# Patient Record
Sex: Female | Born: 1935
Health system: Southern US, Community
[De-identification: ages and names within clinical notes are randomized; demographics above are authoritative.]

## PROBLEM LIST (undated history)

## (undated) DIAGNOSIS — I2510 Atherosclerotic heart disease of native coronary artery without angina pectoris: Secondary | ICD-10-CM

## (undated) DIAGNOSIS — E785 Hyperlipidemia, unspecified: Secondary | ICD-10-CM

## (undated) DIAGNOSIS — F419 Anxiety disorder, unspecified: Secondary | ICD-10-CM

## (undated) DIAGNOSIS — IMO0002 Reserved for concepts with insufficient information to code with codable children: Secondary | ICD-10-CM

## (undated) DIAGNOSIS — I10 Essential (primary) hypertension: Secondary | ICD-10-CM

## (undated) DIAGNOSIS — Z789 Other specified health status: Secondary | ICD-10-CM

## (undated) HISTORY — DX: Atherosclerotic heart disease of native coronary artery without angina pectoris: I25.10

## (undated) HISTORY — DX: Anxiety disorder, unspecified: F41.9

## (undated) HISTORY — DX: Reserved for concepts with insufficient information to code with codable children: IMO0002

## (undated) HISTORY — PX: CORONARY STENT PLACEMENT: SHX1402

## (undated) HISTORY — DX: Hyperlipidemia, unspecified: E78.5

## (undated) HISTORY — DX: Other specified health status: Z78.9

## (undated) HISTORY — PX: CORONARY ANGIOPLASTY: SHX604

## (undated) HISTORY — DX: Essential (primary) hypertension: I10

---

## 1998-01-17 HISTORY — PX: CARDIAC CATHETERIZATION: SHX172

## 1998-10-25 HISTORY — PX: OTHER SURGICAL HISTORY: SHX169

## 1999-02-28 ENCOUNTER — Emergency Department (HOSPITAL_COMMUNITY): Admission: EM | Admit: 1999-02-28 | Discharge: 1999-02-28 | Payer: Self-pay | Admitting: Emergency Medicine

## 1999-02-28 ENCOUNTER — Encounter: Payer: Self-pay | Admitting: Emergency Medicine

## 1999-07-19 ENCOUNTER — Inpatient Hospital Stay (HOSPITAL_COMMUNITY): Admission: EM | Admit: 1999-07-19 | Discharge: 1999-07-22 | Payer: Self-pay | Admitting: Emergency Medicine

## 1999-07-19 ENCOUNTER — Encounter: Payer: Self-pay | Admitting: Emergency Medicine

## 1999-09-23 ENCOUNTER — Encounter: Admission: RE | Admit: 1999-09-23 | Discharge: 1999-12-18 | Payer: Self-pay | Admitting: Neurology

## 2000-07-04 ENCOUNTER — Encounter: Payer: Self-pay | Admitting: *Deleted

## 2000-07-04 ENCOUNTER — Emergency Department (HOSPITAL_COMMUNITY): Admission: EM | Admit: 2000-07-04 | Discharge: 2000-07-04 | Payer: Self-pay | Admitting: Emergency Medicine

## 2000-10-17 ENCOUNTER — Emergency Department (HOSPITAL_COMMUNITY): Admission: EM | Admit: 2000-10-17 | Discharge: 2000-10-17 | Payer: Self-pay | Admitting: Emergency Medicine

## 2000-10-17 ENCOUNTER — Encounter: Payer: Self-pay | Admitting: Emergency Medicine

## 2010-10-25 ENCOUNTER — Emergency Department (HOSPITAL_COMMUNITY)
Admission: EM | Admit: 2010-10-25 | Discharge: 2010-10-25 | Payer: Self-pay | Source: Home / Self Care | Admitting: Emergency Medicine

## 2011-01-04 LAB — URINALYSIS, ROUTINE W REFLEX MICROSCOPIC
Bilirubin Urine: NEGATIVE
Glucose, UA: NEGATIVE mg/dL
Hgb urine dipstick: NEGATIVE
Ketones, ur: NEGATIVE mg/dL
Nitrite: NEGATIVE
Protein, ur: NEGATIVE mg/dL
Specific Gravity, Urine: 1.008 (ref 1.005–1.030)
Urobilinogen, UA: 0.2 mg/dL (ref 0.0–1.0)
pH: 7.5 (ref 5.0–8.0)

## 2011-01-04 LAB — URINE MICROSCOPIC-ADD ON

## 2013-12-17 ENCOUNTER — Encounter: Payer: Self-pay | Admitting: Cardiology

## 2013-12-17 DIAGNOSIS — E785 Hyperlipidemia, unspecified: Secondary | ICD-10-CM

## 2013-12-17 DIAGNOSIS — F411 Generalized anxiety disorder: Secondary | ICD-10-CM | POA: Insufficient documentation

## 2013-12-17 DIAGNOSIS — I10 Essential (primary) hypertension: Secondary | ICD-10-CM | POA: Insufficient documentation

## 2014-03-26 ENCOUNTER — Encounter: Payer: Self-pay | Admitting: Cardiology

## 2014-03-26 ENCOUNTER — Other Ambulatory Visit: Payer: Self-pay | Admitting: *Deleted

## 2014-03-26 ENCOUNTER — Ambulatory Visit (INDEPENDENT_AMBULATORY_CARE_PROVIDER_SITE_OTHER): Payer: Medicare Other | Admitting: Cardiology

## 2014-03-26 VITALS — BP 137/79 | HR 103 | Ht 59.0 in | Wt 162.0 lb

## 2014-03-26 DIAGNOSIS — R011 Cardiac murmur, unspecified: Secondary | ICD-10-CM

## 2014-03-26 DIAGNOSIS — I10 Essential (primary) hypertension: Secondary | ICD-10-CM

## 2014-03-26 DIAGNOSIS — E785 Hyperlipidemia, unspecified: Secondary | ICD-10-CM

## 2014-03-26 DIAGNOSIS — R0602 Shortness of breath: Secondary | ICD-10-CM

## 2014-03-26 DIAGNOSIS — I2510 Atherosclerotic heart disease of native coronary artery without angina pectoris: Secondary | ICD-10-CM

## 2014-03-26 MED ORDER — AMLODIPINE BESYLATE 10 MG PO TABS: 10.0000 mg | ORAL_TABLET | Freq: Every day | ORAL | Status: DC

## 2014-03-26 MED ORDER — PRAVASTATIN SODIUM 40 MG PO TABS: 40.0000 mg | ORAL_TABLET | Freq: Every day | ORAL | Status: DC

## 2014-03-26 MED ORDER — METOPROLOL TARTRATE 50 MG PO TABS: 50.0000 mg | ORAL_TABLET | Freq: Two times a day (BID) | ORAL | Status: DC

## 2014-03-26 MED ORDER — TRIAMTERENE-HCTZ 37.5-25 MG PO CAPS
1.0000 | ORAL_CAPSULE | Freq: Every day | ORAL | Status: DC
Start: 2014-03-26 — End: 2014-12-09

## 2014-03-26 NOTE — Progress Notes (Signed)
[facility] [place], [place], [place]  [address] Phone: [phone] Fax:  [phone]  Date:  03/26/2014   ID:  Suzanne Miller, DOB Jan 23, 1930, MRN [id number]  PCP:  No primary provider on file.  Cardiologist:  Fransico Him, MD     History of Present Illness: Suzanne Miller is a 80 y.o. female with a history of single vessel ASCAD s/p PCI of mid RCA in 1999, HTN and dyslipidemia who presents today for followup.  She is doing well.  She denies any chest pain, LE edema, dizziness, palpitations or syncope.  She has chronic DOE which she thinks has gotten worse recently.  She has difficulty ambulating any more due to DOE.  She also has back pain and has to walk with a cane.  Her heart races if she gets upset but it resolves when she takes a Lorazepam.   Wt Readings from Last 3 Encounters:  03/26/14 162 lb (73.483 kg)     Past Medical History  Diagnosis Date  . ASCVD (arteriosclerotic cardiovascular disease)     singel vessel s/p PCI of RCA  . Hypertension   . Anxiety     intermittent  . Statin intolerance     most statins and zetia  . Positional vertigo     chronic  . Hyperlipidemia   . CAD (coronary artery disease), native coronary artery     s/p PCI of RCA    Current Outpatient Prescriptions  Medication Sig Dispense Refill  . amLODipine (NORVASC) 10 MG tablet Take 10 mg by mouth daily.      Marland Kitchen aspirin 81 MG tablet Take 162 mg by mouth daily.      . diazepam (VALIUM) 5 MG tablet Take 0.25 mg by mouth every 6 (six) hours as needed for anxiety.      . metoprolol (LOPRESSOR) 50 MG tablet Take 50 mg by mouth 2 (two) times daily.      . Multiple Vitamins-Minerals (MULTIVITAMIN PO) Take by mouth.      . nitroGLYCERIN (NITROSTAT) 0.4 MG SL tablet Place 0.4 mg under the tongue every 5 (five) minutes as needed for chest pain.      . pravastatin (PRAVACHOL) 40 MG tablet Take 40 mg by mouth daily.      Marland Kitchen triamterene-hydrochlorothiazide (DYAZIDE) 37.5-25 MG per capsule Take 1 capsule  by mouth daily.      . vitamin B-12 (CYANOCOBALAMIN) 500 MCG tablet Take 500 mcg by mouth daily.      . Vitamin D, Cholecalciferol, 1000 UNITS TABS Take by mouth.       No current facility-administered medications for this visit.    Allergies:    Allergies  Allergen Reactions  . Penicillins   . Sulfa Antibiotics   . Ticlid [Ticlopidine]     Social History:  The patient  reports that she has quit smoking. She does not have any smokeless tobacco history on file. She reports that she does not drink alcohol or use illicit drugs.   Family History:  The patient's family history includes Arrhythmia in her sister; Diabetes in her brother, brother, mother, and sister; Heart disease in her brother, brother, and sister; Pneumonia in her father.   ROS:  Please see the history of present illness.      All other systems reviewed and negative.   PHYSICAL EXAM: VS:  BP 137/79  Pulse 103  Ht 4\' 11"  (1.499 m)  Wt 162 lb (73.483 kg)  BMI 32.70 kg/m2 Well nourished, well  developed, in no acute distress HEENT: normal Neck: no JVD Cardiac:  normal S1, S2; RRR; 1/6 SM at LUSB to LLSB Lungs:  clear to auscultation bilaterally, no wheezing, rhonchi or rales Abd: soft, nontender, no hepatomegaly Ext: no edema Skin: warm and dry Neuro:  CNs 2-12 intact, no focal abnormalities noted  EKG:  Sinus tachycardia at 103bpm with nonspecific T wave abnormality     ASSESSMENT AND PLAN:  1. Single vessel ASCAD s/p PCI of RCA remotely with no angina - continue ASA - Lexiscan myoview to rule out ischemia given her worsening DOE 2. HTN well controlled - continue metoprolol/amlodipine/Dyazide - check BMET 3. Dyslipidemia - check fasting lipid and ALT - continue pravastatin 4.  Heart murmur - check 2D echo  Followup with me in 1 year  Signed, Fransico Him, MD 03/26/2014 2:02 PM

## 2014-03-26 NOTE — Patient Instructions (Signed)
Your physician recommends that you continue on your current medications as directed. Please refer to the Current Medication list given to you today. Your physician has requested that you have a lexiscan myoview. For further information please visit [email or web address]. Please follow instruction sheet, as given.  Your physician has requested that you have an echocardiogram. Echocardiography is a painless test that uses sound waves to create images of your heart. It provides your doctor with information about the size and shape of your heart and how well your heart's chambers and valves are working. This procedure takes approximately one hour. There are no restrictions for this procedure.  Your physician recommends that you return for lab work in: [place].  (LIPIDS, ALT, BMET)

## 2014-03-27 ENCOUNTER — Other Ambulatory Visit: Payer: Self-pay | Admitting: *Deleted

## 2014-03-27 MED ORDER — NITROGLYCERIN 0.4 MG SL SUBL: 0.4000 mg | SUBLINGUAL_TABLET | SUBLINGUAL | Status: DC | PRN

## 2014-04-04 ENCOUNTER — Other Ambulatory Visit (INDEPENDENT_AMBULATORY_CARE_PROVIDER_SITE_OTHER): Payer: Medicare Other

## 2014-04-04 DIAGNOSIS — I2510 Atherosclerotic heart disease of native coronary artery without angina pectoris: Secondary | ICD-10-CM

## 2014-04-04 DIAGNOSIS — E785 Hyperlipidemia, unspecified: Secondary | ICD-10-CM

## 2014-04-04 DIAGNOSIS — R011 Cardiac murmur, unspecified: Secondary | ICD-10-CM

## 2014-04-04 DIAGNOSIS — R0602 Shortness of breath: Secondary | ICD-10-CM

## 2014-04-04 DIAGNOSIS — I10 Essential (primary) hypertension: Secondary | ICD-10-CM

## 2014-04-04 LAB — LIPID PANEL
Cholesterol: 145 mg/dL (ref 0–200)
HDL: 38.1 mg/dL — AB (ref >39.00)
LDL Cholesterol: 79 mg/dL (ref 0–99)
NonHDL: 106.9
TRIGLYCERIDES: 141 mg/dL (ref 0.0–149.0)
Total CHOL/HDL Ratio: 4
VLDL: 28.2 mg/dL (ref 0.0–40.0)

## 2014-04-04 LAB — BASIC METABOLIC PANEL
BUN: 20 mg/dL (ref 6–23)
CALCIUM: 9.2 mg/dL (ref 8.4–10.5)
CO2: 28 meq/L (ref 19–32)
Chloride: 106 mEq/L (ref 96–112)
Creatinine, Ser: 1.5 mg/dL — ABNORMAL HIGH (ref 0.4–1.2)
GFR: 35.93 mL/min — AB (ref >60.00)
Glucose, Bld: 100 mg/dL — ABNORMAL HIGH (ref 70–99)
Potassium: 3.9 mEq/L (ref 3.5–5.1)
SODIUM: 141 meq/L (ref 135–145)

## 2014-04-04 LAB — ALT: ALT: 33 U/L (ref 0–35)

## 2014-04-08 ENCOUNTER — Telehealth: Payer: Self-pay | Admitting: Cardiology

## 2014-04-08 NOTE — Telephone Encounter (Signed)
Follow Up    Pt calling following up on lab results. Please call.

## 2014-04-08 NOTE — Telephone Encounter (Signed)
PT is aware of results.  

## 2014-04-09 ENCOUNTER — Other Ambulatory Visit: Payer: Self-pay | Admitting: General Surgery

## 2014-04-09 ENCOUNTER — Encounter: Payer: Self-pay | Admitting: General Surgery

## 2014-04-09 DIAGNOSIS — E785 Hyperlipidemia, unspecified: Secondary | ICD-10-CM

## 2014-04-16 ENCOUNTER — Other Ambulatory Visit (HOSPITAL_COMMUNITY): Payer: Medicare Other

## 2014-05-07 ENCOUNTER — Other Ambulatory Visit (HOSPITAL_COMMUNITY): Payer: Medicare Other

## 2014-06-24 ENCOUNTER — Other Ambulatory Visit: Payer: Self-pay

## 2014-10-09 ENCOUNTER — Other Ambulatory Visit (INDEPENDENT_AMBULATORY_CARE_PROVIDER_SITE_OTHER): Payer: Medicare Other | Admitting: *Deleted

## 2014-10-09 DIAGNOSIS — E785 Hyperlipidemia, unspecified: Secondary | ICD-10-CM

## 2014-10-09 LAB — LIPID PANEL
CHOLESTEROL: 140 mg/dL (ref 0–200)
HDL: 38.1 mg/dL — AB (ref >39.00)
LDL Cholesterol: 81 mg/dL (ref 0–99)
NonHDL: 101.9
Total CHOL/HDL Ratio: 4
Triglycerides: 103 mg/dL (ref 0.0–149.0)
VLDL: 20.6 mg/dL (ref 0.0–40.0)

## 2014-10-09 LAB — HEPATIC FUNCTION PANEL
ALBUMIN: 4.1 g/dL (ref 3.5–5.2)
ALK PHOS: 76 U/L (ref 39–117)
ALT: 28 U/L (ref 0–35)
AST: 28 U/L (ref 0–37)
Bilirubin, Direct: 0 mg/dL (ref 0.0–0.3)
TOTAL PROTEIN: 7 g/dL (ref 6.0–8.3)
Total Bilirubin: 0.5 mg/dL (ref 0.2–1.2)

## 2014-10-10 ENCOUNTER — Telehealth: Payer: Self-pay | Admitting: Cardiology

## 2014-10-10 DIAGNOSIS — E785 Hyperlipidemia, unspecified: Secondary | ICD-10-CM

## 2014-10-10 NOTE — Telephone Encounter (Signed)
Pt is aware of lab results and MD's recommendations. Pt is aware to take 80 mg of Pravastatin. Pt States that she does not need for [place] to send a new prescription now. Pt has an appointment for labs FLP and ALT on Monday  March 1 st. Pt is aware.

## 2014-10-10 NOTE — Telephone Encounter (Signed)
New Msg     Pt returning call about blood work. Please contact pt at [phone].

## 2014-10-11 ENCOUNTER — Other Ambulatory Visit: Payer: Self-pay

## 2014-10-11 MED ORDER — PRAVASTATIN SODIUM 40 MG PO TABS: 80.0000 mg | ORAL_TABLET | Freq: Every day | ORAL | Status: DC

## 2014-12-09 ENCOUNTER — Other Ambulatory Visit: Payer: Self-pay | Admitting: Cardiology

## 2014-12-24 ENCOUNTER — Other Ambulatory Visit: Payer: Medicare Other

## 2015-01-07 ENCOUNTER — Other Ambulatory Visit (INDEPENDENT_AMBULATORY_CARE_PROVIDER_SITE_OTHER): Payer: Medicare Other | Admitting: *Deleted

## 2015-01-07 DIAGNOSIS — E785 Hyperlipidemia, unspecified: Secondary | ICD-10-CM

## 2015-01-07 LAB — LIPID PANEL
CHOL/HDL RATIO: 4
Cholesterol: 141 mg/dL (ref 0–200)
HDL: 40 mg/dL (ref >39.00)
LDL CALC: 79 mg/dL (ref 0–99)
NonHDL: 101
TRIGLYCERIDES: 112 mg/dL (ref 0.0–149.0)
VLDL: 22.4 mg/dL (ref 0.0–40.0)

## 2015-01-07 LAB — ALT: ALT: 29 U/L (ref 0–35)

## 2015-04-10 ENCOUNTER — Ambulatory Visit (INDEPENDENT_AMBULATORY_CARE_PROVIDER_SITE_OTHER): Payer: Medicare Other | Admitting: Cardiology

## 2015-04-10 ENCOUNTER — Encounter: Payer: Self-pay | Admitting: Cardiology

## 2015-04-10 VITALS — BP 142/70 | HR 74 | Ht 59.0 in | Wt 162.8 lb

## 2015-04-10 DIAGNOSIS — I10 Essential (primary) hypertension: Secondary | ICD-10-CM

## 2015-04-10 DIAGNOSIS — I2510 Atherosclerotic heart disease of native coronary artery without angina pectoris: Secondary | ICD-10-CM | POA: Diagnosis not present

## 2015-04-10 DIAGNOSIS — E785 Hyperlipidemia, unspecified: Secondary | ICD-10-CM

## 2015-04-10 DIAGNOSIS — R011 Cardiac murmur, unspecified: Secondary | ICD-10-CM

## 2015-04-10 MED ORDER — PRAVASTATIN SODIUM 40 MG PO TABS: 40.0000 mg | ORAL_TABLET | Freq: Every day | ORAL | Status: DC

## 2015-04-10 MED ORDER — TRIAMTERENE-HCTZ 37.5-25 MG PO CAPS
1.0000 | ORAL_CAPSULE | Freq: Every day | ORAL | Status: DC
Start: 2015-04-10 — End: 2015-04-24

## 2015-04-10 MED ORDER — AMLODIPINE BESYLATE 10 MG PO TABS: 10.0000 mg | ORAL_TABLET | Freq: Every day | ORAL | Status: DC

## 2015-04-10 MED ORDER — METOPROLOL TARTRATE 50 MG PO TABS: 50.0000 mg | ORAL_TABLET | Freq: Two times a day (BID) | ORAL | Status: DC

## 2015-04-10 NOTE — Patient Instructions (Addendum)
Medication Instructions:  Your physician recommends that you continue on your current medications as directed. Please refer to the Current Medication list given to you today.   Labwork: Your physician recommends that you return for FASTING lab work in: [place] (BMET, LFTs, Lipids).  Testing/Procedures: Your physician has requested that you have an echocardiogram. Echocardiography is a painless test that uses sound waves to create images of your heart. It provides your doctor with information about the size and shape of your heart and how well your heart's chambers and valves are working. This procedure takes approximately one hour. There are no restrictions for this procedure.  Follow-Up: Your physician wants you to follow-up in: 1 year with Dr. Radford Pax. You will receive a reminder letter in the mail two months in advance. If you don't receive a letter, please call our office to schedule the follow-up appointment.   Any Other Special Instructions Will Be Listed Below (If Applicable).

## 2015-04-10 NOTE — Addendum Note (Signed)
Addended by: Harland German A on: 04/10/2015 03:03 PM   Modules accepted: Orders

## 2015-04-10 NOTE — Progress Notes (Signed)
Cardiology Office Note   Date:  04/10/2015   ID:  Suzanne Miller, DOB 26-Feb-1930, MRN [id number]  PCP:  Lottie Dawson, MD    Chief Complaint  Patient presents with  . Follow-up    SOB      History of Present Illness: Suzanne Miller is a 81 y.o. female with a history of single vessel ASCAD s/p PCI of mid RCA in 1999, HTN and dyslipidemia who presents today for followup. She is doing well. She denies any chest pain, SOB, DOE, LE edema, dizziness, palpitations or syncope. She has chronic DOE. She also has back pain and has to walk with a cane. She was supposed to have a stress test for DOE and echo for heart murmur a year ago but she never got these done.  She says that she does not want a nuclear stress test.  She now tells me that she never had CP or SOB with walking and that it was low back.      Past Medical History  Diagnosis Date  . ASCVD (arteriosclerotic cardiovascular disease)     singel vessel s/p PCI of RCA  . Hypertension   . Anxiety     intermittent  . Statin intolerance     most statins and zetia  . Positional vertigo     chronic  . Hyperlipidemia   . CAD (coronary artery disease), native coronary artery     s/p PCI of RCA    Past Surgical History  Procedure Laterality Date  . Broken ankle  2000  . Cardiac catheterization  01/17/1998    single vessel, s/p PTCA stent mid RCA   . Coronary angioplasty       Current Outpatient Prescriptions  Medication Sig Dispense Refill  . amLODipine (NORVASC) 10 MG tablet Take 1 tablet by mouth  daily 90 tablet 1  . aspirin 81 MG tablet Take 162 mg by mouth daily.    . diazepam (VALIUM) 5 MG tablet Take 0.25 mg by mouth every 6 (six) hours as needed for anxiety.    . metoprolol (LOPRESSOR) 50 MG tablet Take 1 tablet by mouth two  times daily 180 tablet 1  . Multiple Vitamins-Minerals (MULTIVITAMIN PO) Take by mouth.    . nitroGLYCERIN (NITROSTAT) 0.4 MG SL tablet Place 1 tablet (0.4  mg total) under the tongue every 5 (five) minutes as needed for chest pain. 25 tablet 5  . pravastatin (PRAVACHOL) 40 MG tablet Take 1 tablet by mouth  daily 90 tablet 2  . triamterene-hydrochlorothiazide (DYAZIDE) 37.5-25 MG per capsule Take 1 capsule by mouth  daily 90 capsule 1  . vitamin B-12 (CYANOCOBALAMIN) 500 MCG tablet Take 500 mcg by mouth daily.    . Vitamin D, Cholecalciferol, 1000 UNITS TABS Take by mouth.     No current facility-administered medications for this visit.    Allergies:   Penicillins; Sulfa antibiotics; and Ticlid    Social History:  The patient  reports that she has quit smoking. She does not have any smokeless tobacco history on file. She reports that she does not drink alcohol or use illicit drugs.   Family History:  The patient's family history includes Arrhythmia in her sister; Diabetes in her brother, brother, mother, and sister; Heart disease in her brother, brother, and sister; Pneumonia in her father.    ROS:  Please see the history of present illness.  Otherwise, review of systems are positive for constipation, back and muscle pain.   All other systems are reviewed and negative.    PHYSICAL EXAM: VS:  BP 142/70 mmHg  Pulse 74  Ht 4\' 11"  (1.499 m)  Wt 162 lb 12.8 oz (73.846 kg)  BMI 32.86 kg/m2 , BMI Body mass index is 32.86 kg/(m^2). GEN: Well nourished, well developed, in no acute distress HEENT: normal Neck: no JVD, carotid bruits, or masses Cardiac: RRR; no murmurs, rubs, or gallops,no edema  Respiratory:  clear to auscultation bilaterally, normal work of breathing GI: soft, nontender, nondistended, + BS MS: no deformity or atrophy Skin: warm and dry, no rash Neuro:  Strength and sensation are intact Psych: euthymic mood, full affect   EKG:  EKG was ordered today and showed NSR with no ST changes    Recent Labs: 01/07/2015: ALT 29    Lipid Panel    Component Value Date/Time   CHOL 141 01/07/2015 0909   TRIG 112.0 01/07/2015  0909   HDL 40.00 01/07/2015 0909   CHOLHDL 4 01/07/2015 0909   VLDL 22.4 01/07/2015 0909   LDLCALC 79 01/07/2015 0909      Wt Readings from Last 3 Encounters:  04/10/15 162 lb 12.8 oz (73.846 kg)  03/26/14 162 lb (73.483 kg)    ASSESSMENT AND PLAN:  1. Single vessel ASCAD s/p PCI of RCA remotely - she denies any chest pain or DOE - continue ASA 2. HTN well controlled - continue metoprolol/amlodipine/Dyazide - check BMET 3. Dyslipidemia - last LDL 79 (goal <70) - check fasting lipid and ALT - continue pravastatin 4. Heart murmur- I will get a 2D echo      Current medicines are reviewed at length with the patient today.  The patient does not have concerns regarding medicines.  The following changes have been made:  no change  Labs/ tests ordered today: See above Assessment and Plan No orders of the defined types were placed in this encounter.     Disposition:   FU with me in 1 year  Signed, Sueanne Margarita, MD  04/10/2015 2:27 PM    [facility] [place], [place], [place]  [address] Phone: [phone]; Fax: [phone]

## 2015-04-22 ENCOUNTER — Other Ambulatory Visit (INDEPENDENT_AMBULATORY_CARE_PROVIDER_SITE_OTHER): Payer: Medicare Other

## 2015-04-22 DIAGNOSIS — I10 Essential (primary) hypertension: Secondary | ICD-10-CM

## 2015-04-22 DIAGNOSIS — E785 Hyperlipidemia, unspecified: Secondary | ICD-10-CM

## 2015-04-22 LAB — LIPID PANEL
CHOL/HDL RATIO: 4
Cholesterol: 141 mg/dL (ref 0–200)
HDL: 37.8 mg/dL — AB (ref >39.00)
LDL CALC: 79 mg/dL (ref 0–99)
NONHDL: 103.2
Triglycerides: 123 mg/dL (ref 0.0–149.0)
VLDL: 24.6 mg/dL (ref 0.0–40.0)

## 2015-04-22 LAB — HEPATIC FUNCTION PANEL
ALBUMIN: 4.2 g/dL (ref 3.5–5.2)
ALT: 24 U/L (ref 0–35)
AST: 25 U/L (ref 0–37)
Alkaline Phosphatase: 75 U/L (ref 39–117)
BILIRUBIN DIRECT: 0 mg/dL (ref 0.0–0.3)
BILIRUBIN TOTAL: 0.5 mg/dL (ref 0.2–1.2)
Total Protein: 6.9 g/dL (ref 6.0–8.3)

## 2015-04-22 LAB — BASIC METABOLIC PANEL
BUN: 23 mg/dL (ref 6–23)
CALCIUM: 9.4 mg/dL (ref 8.4–10.5)
CO2: 27 mEq/L (ref 19–32)
Chloride: 105 mEq/L (ref 96–112)
Creatinine, Ser: 1.57 mg/dL — ABNORMAL HIGH (ref 0.40–1.20)
GFR: 33.48 mL/min — AB (ref >60.00)
Glucose, Bld: 114 mg/dL — ABNORMAL HIGH (ref 70–99)
POTASSIUM: 4.1 meq/L (ref 3.5–5.1)
Sodium: 142 mEq/L (ref 135–145)

## 2015-04-24 ENCOUNTER — Telehealth: Payer: Self-pay

## 2015-04-24 DIAGNOSIS — I10 Essential (primary) hypertension: Secondary | ICD-10-CM

## 2015-04-24 NOTE — Telephone Encounter (Signed)
-----   Message from Sueanne Margarita, MD sent at 04/22/2015 10:38 PM EDT ----- LDL not at goal - increase pravachol to 80mg  daily and recheck FLP and ALT in 6 weeks.  STop dyazide due to renal insuff and recheck BMET in 1 week

## 2015-04-24 NOTE — Telephone Encounter (Signed)
Informed patient of results and verbal understanding expressed.  Instructed patient to INCREASE PRAVACHOL to 80 mg daily. Patient refuses, stating she has tried the higher dose and it made her "too achy."  She st she will improve her diet and exercise to get her numbers down instead. Instructed patient to [place]. BMET scheduled for Monday July 11. Patient agrees with treatment plan.

## 2015-05-05 ENCOUNTER — Other Ambulatory Visit (INDEPENDENT_AMBULATORY_CARE_PROVIDER_SITE_OTHER): Payer: Medicare Other | Admitting: *Deleted

## 2015-05-05 DIAGNOSIS — I10 Essential (primary) hypertension: Secondary | ICD-10-CM | POA: Diagnosis not present

## 2015-05-05 LAB — BASIC METABOLIC PANEL
BUN: 20 mg/dL (ref 6–23)
CALCIUM: 9.2 mg/dL (ref 8.4–10.5)
CO2: 28 meq/L (ref 19–32)
Chloride: 108 mEq/L (ref 96–112)
Creatinine, Ser: 1.46 mg/dL — ABNORMAL HIGH (ref 0.40–1.20)
GFR: 36.4 mL/min — ABNORMAL LOW (ref >60.00)
Glucose, Bld: 98 mg/dL (ref 70–99)
POTASSIUM: 3.9 meq/L (ref 3.5–5.1)
Sodium: 143 mEq/L (ref 135–145)

## 2015-05-05 NOTE — Addendum Note (Signed)
Addended by: Eulis Foster on: 05/05/2015 08:35 AM   Modules accepted: Orders

## 2015-05-13 ENCOUNTER — Other Ambulatory Visit (HOSPITAL_COMMUNITY): Payer: Medicare Other

## 2016-07-06 ENCOUNTER — Ambulatory Visit (INDEPENDENT_AMBULATORY_CARE_PROVIDER_SITE_OTHER): Payer: Medicare Other | Admitting: Podiatry

## 2016-07-06 ENCOUNTER — Encounter: Payer: Self-pay | Admitting: Podiatry

## 2016-07-06 DIAGNOSIS — L600 Ingrowing nail: Secondary | ICD-10-CM

## 2016-07-06 DIAGNOSIS — L603 Nail dystrophy: Secondary | ICD-10-CM | POA: Diagnosis not present

## 2016-07-06 NOTE — Progress Notes (Signed)
   Subjective:    Patient ID: Suzanne Miller, female    DOB: 24-Apr-1930, 82 y.o.   MRN: [id number]  HPI: She presents today with a chief complaint of pain to the right hallux nail plate. She states that she bumped the nail a few weeks ago and it seemed to have loosened. She states that I am unable to cut this nail because it is so thick and discolored. He states that she keeps it covered with a Band-Aid so that it won't hurt.    Review of Systems  Musculoskeletal: Positive for arthralgias and gait problem.  All other systems reviewed and are negative.      Objective:   Physical Exam:Vital signs are stable she is alert and oriented 3. Pulses are strongly palpable. Neurologic sensorium is intact. Deep tendon reflexes are intact bilateral muscle strength is normal symmetrical bilateral. Orthopedic evaluation demonstrates all joints distal to the ankle for range of motion without crepitation. Cutaneous evaluation demonstrates supple hydrated cutis no erythema edema cellulitis drainage or odor. Toenail is thick and dystrophic. There is mild erythema proximally painful on palpation as well as evaluation.          Assessment & Plan:  Nail dystrophy hallux right. With some paronychia.  Plan: Total nail avulsion was performed today with resection of all necrotic tissue. This was performed after local anesthesia was administered. She tolerated this procedure well. She will start soaking in Betadine warm water and was provided with oral N home-going instructions for the care and soaking of her toe. I will follow-up with her in a couple of weeks.

## 2016-07-06 NOTE — Patient Instructions (Signed)

## 2016-07-07 ENCOUNTER — Emergency Department (HOSPITAL_COMMUNITY)
Admission: EM | Admit: 2016-07-07 | Discharge: 2016-07-07 | Disposition: A | Discharge status: Home / Self Care | Payer: Medicare Other | Attending: Emergency Medicine | Admitting: Emergency Medicine

## 2016-07-07 ENCOUNTER — Encounter (HOSPITAL_COMMUNITY): Payer: Self-pay | Admitting: Emergency Medicine

## 2016-07-07 ENCOUNTER — Emergency Department (HOSPITAL_COMMUNITY): Payer: Medicare Other

## 2016-07-07 DIAGNOSIS — R0789 Other chest pain: Secondary | ICD-10-CM | POA: Diagnosis not present

## 2016-07-07 DIAGNOSIS — I10 Essential (primary) hypertension: Secondary | ICD-10-CM | POA: Insufficient documentation

## 2016-07-07 DIAGNOSIS — Z87891 Personal history of nicotine dependence: Secondary | ICD-10-CM | POA: Diagnosis not present

## 2016-07-07 DIAGNOSIS — Z7982 Long term (current) use of aspirin: Secondary | ICD-10-CM | POA: Insufficient documentation

## 2016-07-07 DIAGNOSIS — I2510 Atherosclerotic heart disease of native coronary artery without angina pectoris: Secondary | ICD-10-CM | POA: Insufficient documentation

## 2016-07-07 DIAGNOSIS — Z955 Presence of coronary angioplasty implant and graft: Secondary | ICD-10-CM | POA: Insufficient documentation

## 2016-07-07 DIAGNOSIS — R079 Chest pain, unspecified: Secondary | ICD-10-CM

## 2016-07-07 DIAGNOSIS — Z79899 Other long term (current) drug therapy: Secondary | ICD-10-CM | POA: Diagnosis not present

## 2016-07-07 LAB — CBC
HCT: 39.1 % (ref 36.0–46.0)
HEMOGLOBIN: 12.6 g/dL (ref 12.0–15.0)
MCH: 29.5 pg (ref 26.0–34.0)
MCHC: 32.2 g/dL (ref 30.0–36.0)
MCV: 91.6 fL (ref 78.0–100.0)
Platelets: 195 10*3/uL (ref 150–400)
RBC: 4.27 MIL/uL (ref 3.87–5.11)
RDW: 12.4 % (ref 11.5–15.5)
WBC: 3.9 10*3/uL — ABNORMAL LOW (ref 4.0–10.5)

## 2016-07-07 LAB — BASIC METABOLIC PANEL
ANION GAP: 9 (ref 5–15)
BUN: 18 mg/dL (ref 6–20)
CALCIUM: 9.3 mg/dL (ref 8.9–10.3)
CO2: 23 mmol/L (ref 22–32)
Chloride: 108 mmol/L (ref 101–111)
Creatinine, Ser: 1.29 mg/dL — ABNORMAL HIGH (ref 0.44–1.00)
GFR calc Af Amer: 43 mL/min — ABNORMAL LOW (ref >60)
GFR, EST NON AFRICAN AMERICAN: 37 mL/min — AB (ref >60)
GLUCOSE: 126 mg/dL — AB (ref 65–99)
Potassium: 4 mmol/L (ref 3.5–5.1)
Sodium: 140 mmol/L (ref 135–145)

## 2016-07-07 LAB — I-STAT TROPONIN, ED
TROPONIN I, POC: 0 ng/mL (ref 0.00–0.08)
Troponin i, poc: 0 ng/mL (ref 0.00–0.08)

## 2016-07-07 MED ORDER — ASPIRIN 81 MG PO CHEW
324.0000 mg | CHEWABLE_TABLET | Freq: Once | ORAL | Status: AC
Start: 2016-07-07 — End: 2016-07-07
  Administered 2016-07-07: 324 mg via ORAL
  Filled 2016-07-07: qty 4

## 2016-07-07 NOTE — ED Notes (Signed)
Patient left at this time with all belongings. 

## 2016-07-07 NOTE — ED Notes (Signed)
Pt out in hall waiting for blood draw.

## 2016-07-07 NOTE — ED Triage Notes (Signed)
Arrives with chest pain/pressure that woke her from sleep, states resolved with half a diazepam. Pt states she thinks she's anxious. No pain at this time. Hx stent placement

## 2016-07-07 NOTE — ED Provider Notes (Signed)
[facility] DEPT Provider Note   CSN: [id number] Arrival date & time: 07/07/16  [id number]  By signing my name below, I, Suzanne Miller, attest that this documentation has been prepared under the direction and in the presence of Suzanne Biles, MD . Electronically Signed: Higinio Miller, Scribe. 07/07/2016. 4:07 AM.  History   Chief Complaint Chief Complaint  Patient presents with  . Chest Pain   The history is provided by the patient. No language interpreter was used.   HPI Comments: Suzanne Miller is a 82 y.o. female with PMHx of CAD, HLD, HTN and ASCVD, who presents to the Emergency Department complaining of gradually resolved, non-radiating, left sided chest pain described as chest tightness that began in the middles of the night and worsened PTA. Pt reports she awoke suddenly from sleep and had difficulty falling back asleep due to tightness in her chest. She notes she took half of a diazepam with moderate relief. Pt states her pain lasted for ~30 minutes; she states she is no longer experiencing pain in the ED. She denies nausea, dizziness, diaphoresis, shortness of breath. She notes her pain could be due to anxiety as she gets scared easily. Pt reports PSHx of cardiac catheterization in on 01/17/98; she states it has been "a while" since her last stress test.   Past Medical History:  Diagnosis Date  . Anxiety    intermittent  . ASCVD (arteriosclerotic cardiovascular disease)    singel vessel s/p PCI of RCA  . CAD (coronary artery disease), native coronary artery    s/p PCI of RCA  . Hyperlipidemia   . Hypertension   . Positional vertigo    chronic  . Statin intolerance    most statins and zetia    Patient Active Problem List   Diagnosis Date Noted  . SOB (shortness of breath) 03/26/2014  . Hyperlipidemia   . CAD (coronary artery disease), native coronary artery   . Essential hypertension, benign 12/17/2013  . Anxiety state, unspecified 12/17/2013    Past Surgical History:    Procedure Laterality Date  . broken ankle  2000  . CARDIAC CATHETERIZATION  01/17/1998   single vessel, s/p PTCA stent mid RCA   . CORONARY ANGIOPLASTY    . CORONARY STENT PLACEMENT      OB History    No data available     Home Medications    Prior to Admission medications   Medication Sig Start Date End Date Taking? Authorizing Provider  amLODipine (NORVASC) 10 MG tablet Take 1 tablet (10 mg total) by mouth daily. 04/10/15  Yes Sueanne Margarita, MD  aspirin 81 MG tablet Take 162 mg by mouth daily.   Yes Historical Provider, MD  diazepam (VALIUM) 5 MG tablet Take 0.25 mg by mouth every 6 (six) hours as needed for anxiety.   Yes Historical Provider, MD  metoprolol (LOPRESSOR) 50 MG tablet Take 1 tablet (50 mg total) by mouth 2 (two) times daily. 04/10/15  Yes Sueanne Margarita, MD  Multiple Vitamins-Minerals (MULTIVITAMIN PO) Take by mouth.   Yes Historical Provider, MD  nitroGLYCERIN (NITROSTAT) 0.4 MG SL tablet Place 1 tablet (0.4 mg total) under the tongue every 5 (five) minutes as needed for chest pain. 03/27/14  Yes Sueanne Margarita, MD  pravastatin (PRAVACHOL) 20 MG tablet Take 20 mg by mouth daily.   Yes Historical Provider, MD  vitamin B-12 (CYANOCOBALAMIN) 500 MCG tablet Take 500 mcg by mouth daily.   Yes Historical Provider, MD  Vitamin D, Cholecalciferol, 1000  UNITS TABS Take by mouth.   Yes Historical Provider, MD  pravastatin (PRAVACHOL) 40 MG tablet Take 1 tablet (40 mg total) by mouth daily. Patient not taking: Reported on 07/07/2016 04/10/15   Sueanne Margarita, MD    Family History Family History  Problem Relation Age of Onset  . Diabetes Mother   . Pneumonia Father   . Heart disease Sister   . Diabetes Sister   . Heart disease Brother   . Diabetes Brother   . Arrhythmia Sister   . Heart disease Brother   . Diabetes Brother     Social History Social History  Substance Use Topics  . Smoking status: Former Research scientist (life sciences)  . Smokeless tobacco: Never Used  . Alcohol use No      Allergies   Penicillins; Sulfa antibiotics; and Ticlid [ticlopidine]   Review of Systems Review of Systems  Constitutional: Negative for diaphoresis and fever.  Respiratory: Negative for shortness of breath.   Cardiovascular: Positive for chest pain.  Gastrointestinal: Negative for nausea.  Neurological: Negative for dizziness.  All other systems reviewed and are negative.  Physical Exam Updated Vital Signs BP 168/78 (BP Location: Right Arm)   Pulse 79   Temp 98.4 F (36.9 C) (Oral)   Resp 17   Ht 4\' 11"  (1.499 m)   Wt 161 lb (73 kg)   SpO2 97%   BMI 32.52 kg/m   Physical Exam  Constitutional: She is oriented to person, place, and time. She appears well-developed and well-nourished.  HENT:  Head: Normocephalic and atraumatic.  Eyes: Conjunctivae are normal. Pupils are equal, round, and reactive to light. Right eye exhibits no discharge. Left eye exhibits no discharge. No scleral icterus.  Neck: Normal range of motion. No JVD present. No tracheal deviation present.  No JVD  Cardiovascular: Normal rate, regular rhythm and normal heart sounds.   Pulmonary/Chest: Effort normal. No stridor.  Lungs clear to auscultation bilaterally   Musculoskeletal:  No pitting edema in lower extremities  Neurological: She is alert and oriented to person, place, and time. Coordination normal.  Psychiatric: She has a normal mood and affect. Her behavior is normal. Judgment and thought content normal.  Nursing note and vitals reviewed.  ED Treatments / Results  Labs (all labs ordered are listed, but only abnormal results are displayed) Labs Reviewed  BASIC METABOLIC PANEL - Abnormal; Notable for the following:       Result Value   Glucose, Bld 126 (*)    Creatinine, Ser 1.29 (*)    GFR calc non Af Amer 37 (*)    GFR calc Af Amer 43 (*)    All other components within normal limits  CBC - Abnormal; Notable for the following:    WBC 3.9 (*)    All other components within normal  limits  I-STAT TROPOININ, ED  I-STAT TROPOININ, ED    EKG  EKG Interpretation  Date/Time:  Wednesday July 07 2016 03:27:39 EDT Ventricular Rate:  81 PR Interval:  154 QRS Duration: 70 QT Interval:  378 QTC Calculation: 439 R Axis:   54 Text Interpretation:  Sinus rhythm with Premature supraventricular complexes Nonspecific ST abnormality Abnormal ECG No acute changes No significant change since last tracing Confirmed by Kathrynn Humble, MD, Thelma Comp [phone]) on 07/07/2016 3:40:47 AM Also confirmed by Kathrynn Humble, MD, Thelma Comp [phone]), editor [place], Leda Gauze [phone])  on 07/07/2016 7:59:53 AM       Radiology No results found.  Procedures Procedures (including critical care time)  Medications Ordered in  ED Medications  aspirin chewable tablet 324 mg (324 mg Oral Given 07/07/16 0713)     Initial Impression / Assessment and Miller / ED Course  I have reviewed the triage vital signs and the nursing notes.  Pertinent labs & imaging results that were available during my care of the patient were reviewed by me and considered in my medical decision making (see chart for details).  Clinical Course  DIAGNOSTIC STUDIES:  Oxygen Saturation is 97% on RA, normal by my interpretation.    COORDINATION OF CARE:  4:03 AM Discussed treatment Miller with pt at bedside and pt agreed to Miller.  I personally performed the services described in this documentation, which was scribed in my presence. The recorded information has been reviewed and is accurate.   Differential diagnosis includes: ACS syndrome CHF exacerbation PUD / Gastritis Esophageal spasm  Pt comes in with cc of chest pain. Chest pain is described as L sided tightness with some dib. It seems like in 2015 she was recommended to get a stress test and she refused. She already has made it clear now that 1. She doesn't want to get admitted, and 2. She doesn't want any dye given to her for stress test.  I think pt needs admission.  Patient  wants to leave against medical advice. Patient understands that her actions will lead to inadequate medical workup, and that she is at risk of complications of missed diagnosis, which includes morbidity and mortality. Specifically for our  Patient she could have a critical CAD that can be missed. We informed the patient that she can get stress test, such as as nuclear stress which don't require dye - she still doesn't want to be admitted.  Patient is demonstrating good capacity to make decision. Patient understands that she needs to return to the ER immediately if his/her symptoms get worse.    Final Clinical Impressions(s) / ED Diagnoses   Final diagnoses:  Left sided chest pain    New Prescriptions Discharge Medication List as of 07/07/2016  7:09 AM       Suzanne Biles, MD 07/09/16 [phone]

## 2016-07-07 NOTE — ED Notes (Signed)
MD at bedside, pt anxious to go home.

## 2016-07-07 NOTE — ED Notes (Signed)
Pt continues to prefer to sit in chair, spot checking vital signs.

## 2016-07-07 NOTE — ED Notes (Signed)
Pt anxious to go home, doesn't want to sit in bed anymore. Doesn't want to stay in ED. Updated that results are back and MD will update her shortly. Asking to be up in chair. Disconnected patient from monitor at her request for position changes.

## 2016-07-15 ENCOUNTER — Telehealth: Payer: Self-pay | Admitting: Cardiology

## 2016-07-15 NOTE — Telephone Encounter (Signed)
I am fine with that 

## 2016-07-15 NOTE — Telephone Encounter (Signed)
New message      Pt is calling wanting to switch from Dr Radford Pax to Dr Tamala Julian.  Please let me know your decision and I will call pt.

## 2016-07-18 NOTE — Telephone Encounter (Signed)
Patient has an excellent cardiologist and does not need me.

## 2016-07-23 ENCOUNTER — Other Ambulatory Visit: Payer: BLUE CROSS/BLUE SHIELD

## 2016-10-05 ENCOUNTER — Ambulatory Visit: Payer: Medicare Other | Admitting: Podiatry

## 2018-01-20 ENCOUNTER — Telehealth: Payer: Self-pay | Admitting: Cardiology

## 2018-01-20 NOTE — Telephone Encounter (Signed)
New Message    Patient wants to switch from Dr Radford Pax to Dr Tamala Julian , please advise

## 2018-01-20 NOTE — Telephone Encounter (Signed)
I am fine with that 

## 2018-01-22 NOTE — Telephone Encounter (Signed)
I can't have "new" established patients. It makes no sense.

## 2018-01-24 ENCOUNTER — Ambulatory Visit: Payer: BLUE CROSS/BLUE SHIELD | Admitting: Internal Medicine

## 2018-02-27 ENCOUNTER — Ambulatory Visit: Payer: Medicare Other | Admitting: Cardiology

## 2018-03-20 NOTE — Progress Notes (Signed)
Cardiology Office Note   Date:  03/24/2018   ID:  Suzanne Miller, DOB 02/10/1930, MRN [id number]  PCP:  Seward Carol, MD  Cardiologist:   Cornelious Bartolucci [place], MD   Chief Complaint  Patient presents with  . Coronary Artery Disease      History of Present Illness: Suzanne Miller is a 84 y.o. female who presents for follow up of CAD. Previously followed by Dr. Fransico Him. Last seen in June 2016. She has a history of single vessel ASCAD s/p PCI of mid RCA in 1999, HTN and dyslipidemia. She reports that she is doing very well but just wanted to get established again with cardiology. She lives alone and is able to do all her own housework. She denies any chest pain, dyspnea, palpitations. She does have some chronic vertigo. Sometimes if she gets very upset her HR will go up. This resolves if she takes diazepam.     Past Medical History:  Diagnosis Date  . Anxiety    intermittent  . ASCVD (arteriosclerotic cardiovascular disease)    singel vessel s/p PCI of RCA  . CAD (coronary artery disease), native coronary artery    s/p PCI of RCA  . Hyperlipidemia   . Hypertension   . Positional vertigo    chronic  . Statin intolerance    most statins and zetia    Past Surgical History:  Procedure Laterality Date  . broken ankle  2000  . CARDIAC CATHETERIZATION  01/17/1998   single vessel, s/p PTCA stent mid RCA   . CORONARY ANGIOPLASTY    . CORONARY STENT PLACEMENT       Current Outpatient Medications  Medication Sig Dispense Refill  . amLODipine (NORVASC) 10 MG tablet Take 1 tablet (10 mg total) by mouth daily. 90 tablet 3  . aspirin 81 MG tablet Take 81 mg by mouth daily.     . diazepam (VALIUM) 5 MG tablet Take 0.25 mg by mouth every 6 (six) hours as needed for anxiety.    . metoprolol (LOPRESSOR) 50 MG tablet Take 1 tablet (50 mg total) by mouth 2 (two) times daily. 180 tablet 3  . Multiple Vitamins-Minerals (MULTIVITAMIN PO) Take 1 tablet by mouth daily.     . nitroGLYCERIN  (NITROSTAT) 0.4 MG SL tablet Place 1 tablet (0.4 mg total) under the tongue every 5 (five) minutes as needed for chest pain. 25 tablet 5  . pravastatin (PRAVACHOL) 20 MG tablet Take 20 mg by mouth daily.    . pravastatin (PRAVACHOL) 40 MG tablet Take 1 tablet (40 mg total) by mouth daily. 90 tablet 3  . vitamin B-12 (CYANOCOBALAMIN) 500 MCG tablet Take 500 mcg by mouth daily.    . Vitamin D, Cholecalciferol, 1000 UNITS TABS Take by mouth.     No current facility-administered medications for this visit.     Allergies:   Penicillins; Sulfa antibiotics; and Ticlid [ticlopidine]    Social History:  The patient  reports that she has quit smoking. She has never used smokeless tobacco. She reports that she does not drink alcohol or use drugs.   Family History:  The patient's family history includes Arrhythmia in her sister; Diabetes in her brother, brother, mother, and sister; Heart disease in her brother, brother, and sister; Pneumonia in her father.    ROS:  Please see the history of present illness.   Otherwise, review of systems are positive for none.   All other systems are reviewed and negative.    PHYSICAL  EXAM: VS:  BP (!) 162/74   Pulse 81   Ht 4\' 11"  (1.499 m)   Wt 157 lb 9.6 oz (71.5 kg)   BMI 31.83 kg/m  , BMI Body mass index is 31.83 kg/m. GEN: Well nourished, well developed, in no acute distress  HEENT: normal  Neck: no JVD, carotid bruits, or masses Cardiac: RRR; no murmurs, rubs, or gallops,no edema  Respiratory:  clear to auscultation bilaterally, normal work of breathing GI: soft, nontender, nondistended, + BS MS: no deformity or atrophy  Skin: warm and dry, no rash Neuro:  Strength and sensation are intact Psych: euthymic mood, full affect   EKG:  EKG is ordered today. The ekg ordered today demonstrates NSR with normal Ecg. I have personally reviewed and interpreted this study.    Recent Labs: No results found for requested labs within last 8760 hours.     Lipid Panel    Component Value Date/Time   CHOL 141 04/22/2015 0842   TRIG 123.0 04/22/2015 0842   HDL 37.80 (L) 04/22/2015 0842   CHOLHDL 4 04/22/2015 0842   VLDL 24.6 04/22/2015 0842   LDLCALC 79 04/22/2015 0842    Labs dated 01/20/18: cholesterol 155, triglycerides 147, HDL 43, LDL 83. Creatinine 1.35. Hgb 12.5. Other chemistries and TSH normal.  Wt Readings from Last 3 Encounters:  03/24/18 157 lb 9.6 oz (71.5 kg)  07/07/16 161 lb (73 kg)  04/10/15 162 lb 12.8 oz (73.8 kg)      Other studies Reviewed: Additional studies/ records that were reviewed today include: none   ASSESSMENT AND PLAN:  1. CAD s/p remote PCI of the RCA in 1999. She is asymptomatic. She is on appropriate therapy with beta blocker, ASA, and statin.  2. Hypercholesterolemia. LDL looks ok. Not quite at goal of 70 but is tolerating pravastatin and did not tolerate higher doses of lipitor in the past.  3. HTN BP is elevated today. Not sure what her baseline is. Asked her to monitor at home and let us know if readings remain high.    Current medicines are reviewed at length with the patient today.  The patient does not have concerns regarding medicines.  The following changes have been made:  no change  Labs/ tests ordered today include:  No orders of the defined types were placed in this encounter.    Disposition:   FU with me in 1 year  Signed, Decari Duggar Martin[place], MD  03/24/2018 3:18 PM    [facility] [address] [place], [place], [address] Phone [phone], Fax [phone]

## 2018-03-24 ENCOUNTER — Encounter: Payer: Self-pay | Admitting: Cardiology

## 2018-03-24 ENCOUNTER — Ambulatory Visit: Payer: Medicare Other | Admitting: Cardiology

## 2018-03-24 VITALS — BP 162/74 | HR 81 | Ht 59.0 in | Wt 157.6 lb

## 2018-03-24 DIAGNOSIS — I2510 Atherosclerotic heart disease of native coronary artery without angina pectoris: Secondary | ICD-10-CM

## 2018-03-24 DIAGNOSIS — I10 Essential (primary) hypertension: Secondary | ICD-10-CM

## 2018-03-24 DIAGNOSIS — E7800 Pure hypercholesterolemia, unspecified: Secondary | ICD-10-CM | POA: Diagnosis not present

## 2018-03-24 NOTE — Addendum Note (Signed)
Addended by: Kathyrn Lass on: 03/24/2018 03:53 PM   Modules accepted: Orders

## 2018-03-24 NOTE — Patient Instructions (Signed)
Monitor your blood pressure at home  I will see you in one year.

## 2019-03-13 ENCOUNTER — Telehealth: Payer: Self-pay | Admitting: Cardiology

## 2019-03-13 NOTE — Telephone Encounter (Signed)
Returned call to patient she stated she does not have a smart phone.Advised Dr.Jordan will do a telephone visit 03/27/19 at 2:40 pm.

## 2019-03-13 NOTE — Telephone Encounter (Signed)
New Message   Patient only able to do televisit please call her to get consent.

## 2019-03-13 NOTE — Telephone Encounter (Signed)
Patient gave permission for a virtual visit 03/27/19.

## 2019-03-23 ENCOUNTER — Telehealth: Payer: Self-pay | Admitting: Cardiology

## 2019-03-23 NOTE — Progress Notes (Signed)
Virtual Visit via Telephone Note   This visit type was conducted due to national recommendations for restrictions regarding the COVID-19 Pandemic (e.g. social distancing) in an effort to limit this patient's exposure and mitigate transmission in our community.  Due to her co-morbid illnesses, this patient is at least at moderate risk for complications without adequate follow up.  This format is felt to be most appropriate for this patient at this time.  The patient did not have access to video technology/had technical difficulties with video requiring transitioning to audio format only (telephone).  All issues noted in this document were discussed and addressed.  No physical exam could be performed with this format.  Please refer to the patient's chart for her  consent to telehealth for [facility].   Date:  03/27/2019   ID:  Suzanne Miller, DOB 07/23/1930, MRN [id number]  Patient Location: Home Provider Location: Office  PCP:  Seward Carol, MD  Cardiologist:  Peter Martinique MD Electrophysiologist:  None   Evaluation Performed:  Follow-Up Visit  Chief Complaint:  Follow up CAD  History of Present Illness:    Suzanne Miller is a 85 y.o. female with a history of single vessel ASCAD s/p PCI of mid RCA in 1999, HTN and dyslipidemia.  She reports she has done well this year. She does note that since the pandemic she is more sedentary and is eating more including salt. She does note some swelling in her feet that goes down at night. No dyspnea or chest pain. Energy level is good.   The patient does not have symptoms concerning for COVID-19 infection (fever, chills, cough, or new shortness of breath).    Past Medical History:  Diagnosis Date  . Anxiety    intermittent  . ASCVD (arteriosclerotic cardiovascular disease)    singel vessel s/p PCI of RCA  . CAD (coronary artery disease), native coronary artery    s/p PCI of RCA  . Hyperlipidemia   . Hypertension   . Positional vertigo     chronic  . Statin intolerance    most statins and zetia   Past Surgical History:  Procedure Laterality Date  . broken ankle  2000  . CARDIAC CATHETERIZATION  01/17/1998   single vessel, s/p PTCA stent mid RCA   . CORONARY ANGIOPLASTY    . CORONARY STENT PLACEMENT       Current Meds  Medication Sig  . amLODipine (NORVASC) 10 MG tablet Take 1 tablet (10 mg total) by mouth daily.  Marland Kitchen aspirin 81 MG tablet Take 81 mg by mouth daily.   . diazepam (VALIUM) 5 MG tablet Take 0.25 mg by mouth every 6 (six) hours as needed for anxiety.  . metoprolol tartrate (LOPRESSOR) 50 MG tablet Take 1 tablet (50 mg total) by mouth 2 (two) times daily.  . nitroGLYCERIN (NITROSTAT) 0.4 MG SL tablet Place 1 tablet (0.4 mg total) under the tongue every 5 (five) minutes as needed for chest pain.  . pravastatin (PRAVACHOL) 20 MG tablet Take 1 tablet (20 mg total) by mouth daily.  . vitamin B-12 (CYANOCOBALAMIN) 500 MCG tablet Take 500 mcg by mouth daily.  . Vitamin D, Cholecalciferol, 1000 UNITS TABS Take by mouth.  . [DISCONTINUED] amLODipine (NORVASC) 10 MG tablet Take 1 tablet (10 mg total) by mouth daily.  . [DISCONTINUED] metoprolol (LOPRESSOR) 50 MG tablet Take 1 tablet (50 mg total) by mouth 2 (two) times daily.  . [DISCONTINUED] pravastatin (PRAVACHOL) 20 MG tablet Take 20 mg by mouth  daily.     Allergies:   Penicillins; Sulfa antibiotics; and Ticlid [ticlopidine]   Social History   Tobacco Use  . Smoking status: Former Research scientist (life sciences)  . Smokeless tobacco: Never Used  Substance Use Topics  . Alcohol use: No  . Drug use: No     Family Hx: The patient's family history includes Arrhythmia in her sister; Diabetes in her brother, brother, mother, and sister; Heart disease in her brother, brother, and sister; Pneumonia in her father.  ROS:   Please see the history of present illness.    All other systems reviewed and are negative.   Prior CV studies:   The following studies were reviewed today:   none  Labs/Other Tests and Data Reviewed:    EKG:  No ECG reviewed.  Recent Labs: No results found for requested labs within last 8760 hours.   Recent Lipid Panel Lab Results  Component Value Date/Time   CHOL 141 04/22/2015 08:42 AM   TRIG 123.0 04/22/2015 08:42 AM   HDL 37.80 (L) 04/22/2015 08:42 AM   CHOLHDL 4 04/22/2015 08:42 AM   LDLCALC 79 04/22/2015 08:42 AM   Labs dated 07/24/18: cholesterol 164, triglycerides 126, HDL 42, LDL 97. Creatinine 1.52. otherwise CMET, CBC and TSH normal   Wt Readings from Last 3 Encounters:  03/27/19 160 lb (72.6 kg)  03/24/18 157 lb 9.6 oz (71.5 kg)  07/07/16 161 lb (73 kg)     Objective:    Vital Signs:  BP (!) 147/77   Pulse 71   Ht 4\' 11"  (1.499 m)   Wt 160 lb (72.6 kg)   BMI 32.32 kg/m    VITAL SIGNS:  reviewed  ASSESSMENT & PLAN:    1. CAD s/p remote PCI of the RCA in 1999. She is asymptomatic. She is on appropriate therapy with beta blocker, ASA, and statin.  2. Hypercholesterolemia. LDL is not ideal but she is intolerant of higher doses of statins. Tolerating pravastatin well.   3. HTN BP is under fairly good control. Advised restricting salt intake as this may affect blood pressure and is probably the cause for her swelling.    COVID-19 Education: The signs and symptoms of COVID-19 were discussed with the patient and how to seek care for testing (follow up with PCP or arrange E-visit).  The importance of social distancing was discussed today.  Time:   Today, I have spent 10 minutes with the patient with telehealth technology discussing the above problems.     Medication Adjustments/Labs and Tests Ordered: Current medicines are reviewed at length with the patient today.  Concerns regarding medicines are outlined above.   Tests Ordered: No orders of the defined types were placed in this encounter.   Medication Changes: Meds ordered this encounter  Medications  . amLODipine (NORVASC) 10 MG tablet    Sig:  Take 1 tablet (10 mg total) by mouth daily.    Dispense:  90 tablet    Refill:  3  . metoprolol tartrate (LOPRESSOR) 50 MG tablet    Sig: Take 1 tablet (50 mg total) by mouth 2 (two) times daily.    Dispense:  180 tablet    Refill:  3  . pravastatin (PRAVACHOL) 20 MG tablet    Sig: Take 1 tablet (20 mg total) by mouth daily.    Dispense:  90 tablet    Refill:  3    Disposition:  Follow up in 1 year(s)  Signed, Peter Martinique, MD  03/27/2019 2:28 PM  [facility]

## 2019-03-23 NOTE — Telephone Encounter (Signed)
No mychart, no smartphone, consent, pre reg complete 03/23/19 AF

## 2019-03-27 ENCOUNTER — Telehealth (INDEPENDENT_AMBULATORY_CARE_PROVIDER_SITE_OTHER): Payer: Medicare Other | Admitting: Cardiology

## 2019-03-27 ENCOUNTER — Encounter: Payer: Self-pay | Admitting: Cardiology

## 2019-03-27 VITALS — BP 147/77 | HR 71 | Ht 59.0 in | Wt 160.0 lb

## 2019-03-27 DIAGNOSIS — E7800 Pure hypercholesterolemia, unspecified: Secondary | ICD-10-CM

## 2019-03-27 DIAGNOSIS — I2510 Atherosclerotic heart disease of native coronary artery without angina pectoris: Secondary | ICD-10-CM

## 2019-03-27 DIAGNOSIS — I10 Essential (primary) hypertension: Secondary | ICD-10-CM

## 2019-03-27 MED ORDER — AMLODIPINE BESYLATE 10 MG PO TABS: 10.0000 mg | ORAL_TABLET | Freq: Every day | ORAL | 3 refills | Status: DC

## 2019-03-27 MED ORDER — PRAVASTATIN SODIUM 20 MG PO TABS: 20.0000 mg | ORAL_TABLET | Freq: Every day | ORAL | 3 refills | Status: DC

## 2019-03-27 MED ORDER — METOPROLOL TARTRATE 50 MG PO TABS: 50.0000 mg | ORAL_TABLET | Freq: Two times a day (BID) | ORAL | 3 refills | Status: DC

## 2019-03-27 NOTE — Patient Instructions (Signed)
Medication Instructions:  Continue same medications If you need a refill on your cardiac medications before your next appointment, please call your pharmacy.   Lab work: None ordered   Testing/Procedures: None ordered  Follow-Up: At [facility], you and your health needs are our priority.  As part of our continuing mission to provide you with exceptional heart care, we have created designated Provider Care Teams.  These Care Teams include your primary Cardiologist (physician) and Advanced Practice Providers (APPs -  Physician Assistants and Nurse Practitioners) who all work together to provide you with the care you need, when you need it. . Schedule follow up appointment in 1 year     Call 3 months before to schedule   

## 2019-11-24 ENCOUNTER — Ambulatory Visit: Payer: Medicare Other

## 2019-11-30 ENCOUNTER — Ambulatory Visit: Payer: Medicare Other | Attending: Internal Medicine

## 2019-11-30 DIAGNOSIS — Z23 Encounter for immunization: Secondary | ICD-10-CM | POA: Insufficient documentation

## 2019-11-30 NOTE — Progress Notes (Signed)
   Covid-19 Vaccination Clinic  Name:  Suzanne Miller    MRN: [id number] DOB: April 13, 1930  11/30/2019  [name] was observed post Covid-19 immunization for 15 minutes without incidence. She was provided with Vaccine Information Sheet and instruction to access the V-Safe system.   [name] was instructed to call 911 with any severe reactions post vaccine: Marland Kitchen Difficulty breathing  . Swelling of your face and throat  . A fast heartbeat  . A bad rash all over your body  . Dizziness and weakness    Immunizations Administered    Name Date Dose VIS Date Route   Pfizer COVID-19 Vaccine 11/30/2019 11:52 AM 0.3 mL 10/05/2019 Intramuscular   Manufacturer: [place]   Lot: [id number]   [place]: [id number]

## 2019-12-25 ENCOUNTER — Ambulatory Visit: Payer: Medicare Other | Attending: Internal Medicine

## 2019-12-25 DIAGNOSIS — Z23 Encounter for immunization: Secondary | ICD-10-CM | POA: Insufficient documentation

## 2019-12-25 NOTE — Progress Notes (Signed)
   Covid-19 Vaccination Clinic  Name:  Suzanne Miller    MRN: [id number] DOB: 1930-10-20  12/25/2019  Suzanne Miller was observed post Covid-19 immunization for 15 minutes without incident. She was provided with Vaccine Information Sheet and instruction to access the V-Safe system.   Suzanne Miller was instructed to call 911 with any severe reactions post vaccine: Marland Kitchen Difficulty breathing  . Swelling of face and throat  . A fast heartbeat  . A bad rash all over body  . Dizziness and weakness   Immunizations Administered    Name Date Dose VIS Date Route   Pfizer COVID-19 Vaccine 12/25/2019 12:18 PM 0.3 mL 10/05/2019 Intramuscular   Manufacturer: [place]   Lot: [id number]   [place]: [id number]

## 2020-02-07 ENCOUNTER — Encounter: Payer: Self-pay | Admitting: Cardiology

## 2020-02-07 ENCOUNTER — Other Ambulatory Visit: Payer: Self-pay | Admitting: Internal Medicine

## 2020-02-07 DIAGNOSIS — M7989 Other specified soft tissue disorders: Secondary | ICD-10-CM

## 2020-02-08 ENCOUNTER — Encounter (HOSPITAL_COMMUNITY): Payer: Medicare Other

## 2020-02-11 ENCOUNTER — Other Ambulatory Visit: Payer: Medicare Other

## 2020-02-11 ENCOUNTER — Ambulatory Visit
Admission: RE | Admit: 2020-02-11 | Discharge: 2020-02-11 | Disposition: A | Discharge status: Home / Self Care | Payer: Medicare Other | Source: Ambulatory Visit | Attending: Internal Medicine | Admitting: Internal Medicine

## 2020-02-11 DIAGNOSIS — M7989 Other specified soft tissue disorders: Secondary | ICD-10-CM

## 2020-02-25 NOTE — Progress Notes (Signed)
Cardiology Office Note   Date:  02/27/2020   ID:  Suzanne Miller, DOB [birth year], MRN [id number]  PCP:  Seward Carol, MD  Cardiologist:   Jeanclaude Wentworth Martinique, MD   Chief Complaint  Patient presents with  . Coronary Artery Disease      History of Present Illness: Suzanne Miller is a 86 y.o. female who presents for follow up of CAD. She has a history of single vessel ASCAD s/p PCI of mid RCA in 1999, HTN and dyslipidemia.   She lives alone and is able to do all her own housework. She denies any chest pain, dyspnea, palpitations. She notes BP has been high but she gets nervous when she goes to the doctor.      Past Medical History:  Diagnosis Date  . Anxiety    intermittent  . ASCVD (arteriosclerotic cardiovascular disease)    singel vessel s/p PCI of RCA  . CAD (coronary artery disease), native coronary artery    s/p PCI of RCA  . Hyperlipidemia   . Hypertension   . Positional vertigo    chronic  . Statin intolerance    most statins and zetia    Past Surgical History:  Procedure Laterality Date  . broken ankle  2000  . CARDIAC CATHETERIZATION  01/17/1998   single vessel, s/p PTCA stent mid RCA   . CORONARY ANGIOPLASTY    . CORONARY STENT PLACEMENT       Current Outpatient Medications  Medication Sig Dispense Refill  . amLODipine (NORVASC) 10 MG tablet Take 1 tablet (10 mg total) by mouth daily. 90 tablet 3  . aspirin 81 MG tablet Take 81 mg by mouth daily.     . diazepam (VALIUM) 5 MG tablet Take 0.25 mg by mouth every 6 (six) hours as needed for anxiety.    . metoprolol tartrate (LOPRESSOR) 50 MG tablet Take 1.5 tablets (75 mg total) by mouth 2 (two) times daily. 270 tablet 3  . Multiple Vitamins-Minerals (MULTIVITAMIN PO) Take 1 tablet by mouth daily.     . nitroGLYCERIN (NITROSTAT) 0.4 MG SL tablet Place 1 tablet (0.4 mg total) under the tongue every 5 (five) minutes as needed for chest pain. 25 tablet 5  . pravastatin (PRAVACHOL) 20 MG tablet Take 1 tablet (20 mg  total) by mouth daily. 90 tablet 3  . vitamin B-12 (CYANOCOBALAMIN) 500 MCG tablet Take 500 mcg by mouth daily.    . Vitamin D, Cholecalciferol, 1000 UNITS TABS Take by mouth.     No current facility-administered medications for this visit.    Allergies:   Penicillins, Sulfa antibiotics, and Ticlid [ticlopidine]    Social History:  The patient  reports that she has quit smoking. She has never used smokeless tobacco. She reports that she does not drink alcohol or use drugs.   Family History:  The patient's family history includes Arrhythmia in her sister; Diabetes in her brother, brother, mother, and sister; Heart disease in her brother, brother, and sister; Pneumonia in her father.    ROS:  Please see the history of present illness.   Otherwise, review of systems are positive for none.   All other systems are reviewed and negative.    PHYSICAL EXAM: VS:  BP (!) 160/72   Pulse (!) 104   Temp (!) 97.4 F (36.3 C)   Ht 4\' 11"  (1.499 m)   Wt 154 lb (69.9 kg)   SpO2 98%   BMI 31.10 kg/m  , BMI Body mass  index is 31.1 kg/m. GEN: Well nourished, well developed, in no acute distress  HEENT: normal  Neck: no JVD, carotid bruits, or masses Cardiac: RRR; no murmurs, rubs, or gallops,no edema  Respiratory:  clear to auscultation bilaterally, normal work of breathing GI: soft, nontender, nondistended, + BS MS: no deformity or atrophy  Skin: warm and dry, no rash Neuro:  Strength and sensation are intact Psych: euthymic mood, full affect   EKG:  EKG is ordered today. The ekg ordered today demonstrates NSR with HR 104. normal Ecg. I have personally reviewed and interpreted this study.    Recent Labs: No results found for requested labs within last 8760 hours.    Lipid Panel    Component Value Date/Time   CHOL 141 04/22/2015 0842   TRIG 123.0 04/22/2015 0842   HDL 37.80 (L) 04/22/2015 0842   CHOLHDL 4 04/22/2015 0842   VLDL 24.6 04/22/2015 0842   LDLCALC 79 04/22/2015 0842      Labs dated 01/20/18: cholesterol 155, triglycerides 147, HDL 43, LDL 83. Creatinine 1.35. Hgb 12.5. Other chemistries and TSH normal. Dated 02/07/20: cholesterol 155, triglycerides 97, HDL 45, LDL 92. BUN 24, creatinine 1.6. Hgb 12.2. otherwise CMET, CBC, TSH normal.  Wt Readings from Last 3 Encounters:  02/27/20 154 lb (69.9 kg)  03/27/19 160 lb (72.6 kg)  03/24/18 157 lb 9.6 oz (71.5 kg)      Other studies Reviewed: Additional studies/ records that were reviewed today include: none   ASSESSMENT AND PLAN:  1. CAD s/p remote PCI of the RCA in 1999. She is asymptomatic. She is on appropriate therapy with beta blocker, ASA, and statin.  2. Hypercholesterolemia. LDL looks ok. Not quite at goal of 70 but is tolerating pravastatin and did not tolerate higher doses of lipitor in the past.  3. HTN BP is elevated today.recommend increasing metoprolol to 75 mg daily. On amlodipine 10 mg daily    4. CKD stage 3a. Focus on BP management.    Current medicines are reviewed at length with the patient today.  The patient does not have concerns regarding medicines.  The following changes have been made:  Increase metoprolol to 75 mg bid.  Labs/ tests ordered today include:   Orders Placed This Encounter  Procedures  . EKG 12-Lead     Disposition:   FU with me in 1 year  Signed, Kadian Barcellos Martinique, MD  02/27/2020 3:02 PM    [facility] [address], [place], [place], [address] Phone [phone], Fax [phone]

## 2020-02-27 ENCOUNTER — Ambulatory Visit: Payer: Medicare Other | Admitting: Cardiology

## 2020-02-27 ENCOUNTER — Encounter: Payer: Self-pay | Admitting: Cardiology

## 2020-02-27 ENCOUNTER — Other Ambulatory Visit: Payer: Self-pay

## 2020-02-27 VITALS — BP 160/72 | HR 104 | Temp 97.4°F | Ht 59.0 in | Wt 154.0 lb

## 2020-02-27 DIAGNOSIS — I10 Essential (primary) hypertension: Secondary | ICD-10-CM

## 2020-02-27 DIAGNOSIS — E7800 Pure hypercholesterolemia, unspecified: Secondary | ICD-10-CM

## 2020-02-27 DIAGNOSIS — I2510 Atherosclerotic heart disease of native coronary artery without angina pectoris: Secondary | ICD-10-CM | POA: Diagnosis not present

## 2020-02-27 MED ORDER — METOPROLOL TARTRATE 50 MG PO TABS: 75.0000 mg | ORAL_TABLET | Freq: Two times a day (BID) | ORAL | 3 refills | Status: DC

## 2020-02-27 NOTE — Patient Instructions (Signed)
Increase metoprolol to 75 mg twice a day. That's 1.5 tablets twice a day  Monitor your blood pressure at home. Let me know if it remains high.

## 2020-02-29 ENCOUNTER — Other Ambulatory Visit: Payer: Self-pay | Admitting: Cardiology

## 2020-09-06 ENCOUNTER — Ambulatory Visit: Payer: Medicare Other

## 2020-12-15 IMAGING — US US EXTREM LOW VENOUS*L*
1 series · 13 of 24 positions shown · non-contrast
Comparison: None.

CLINICAL DATA: Left leg swelling for 1 week



[Series 1: us extrem low venous*left* · 0.10mm/px · 13 of 32 slices shown]
[im 1/32]
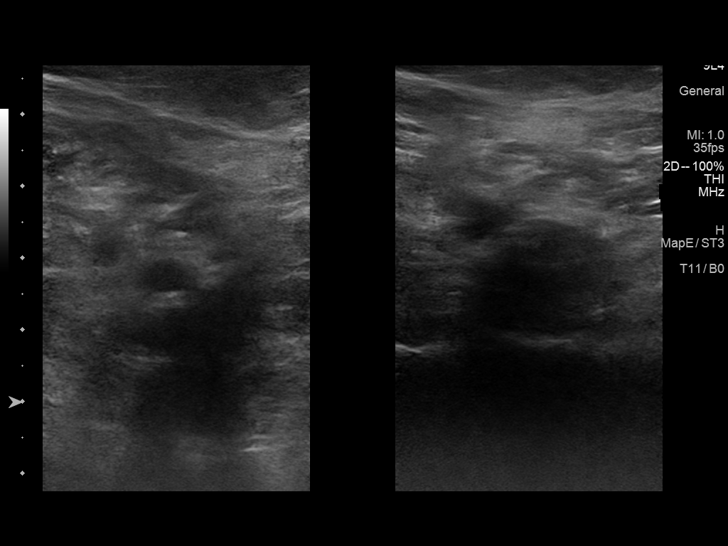
[im 3/32]
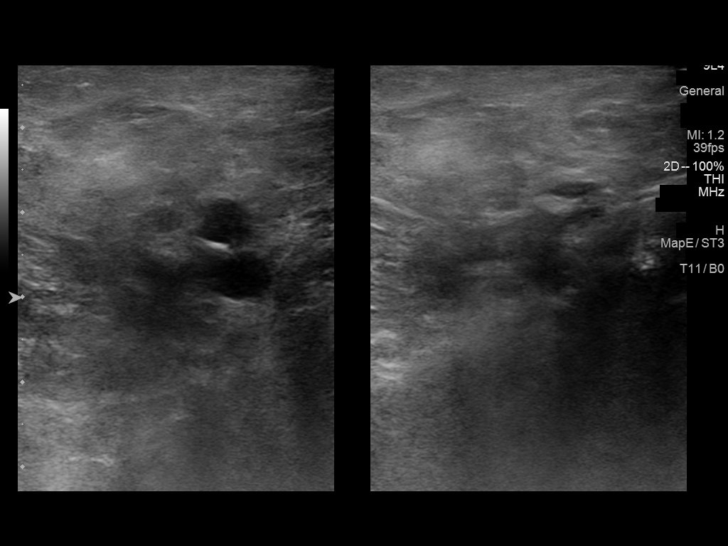
[im 6/32]
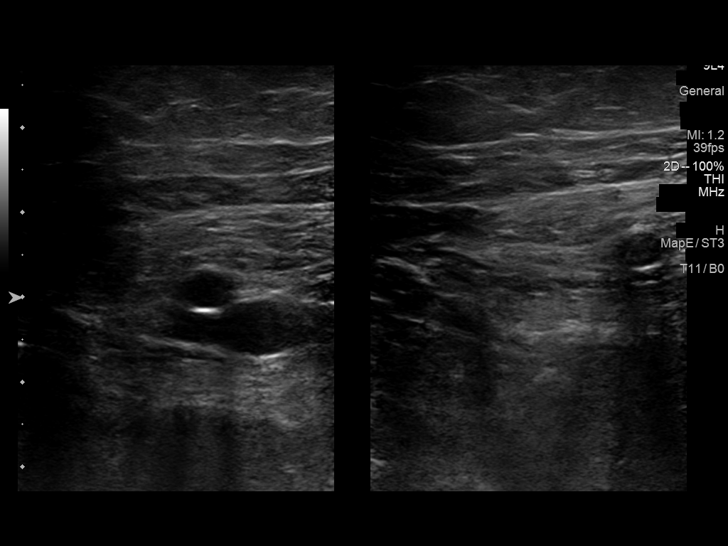
[im 9/32]
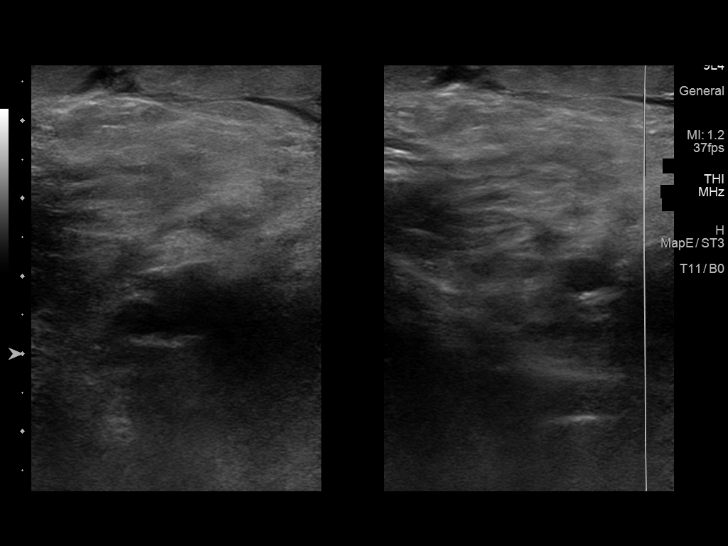
[im 11/32]
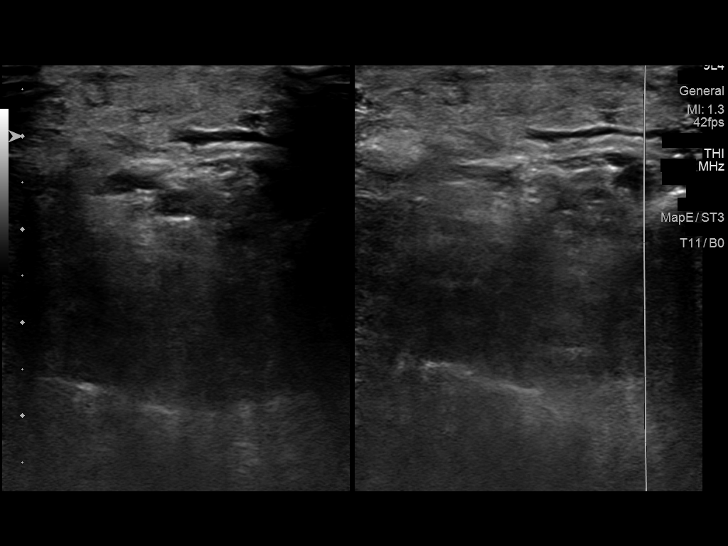
[im 14/32]
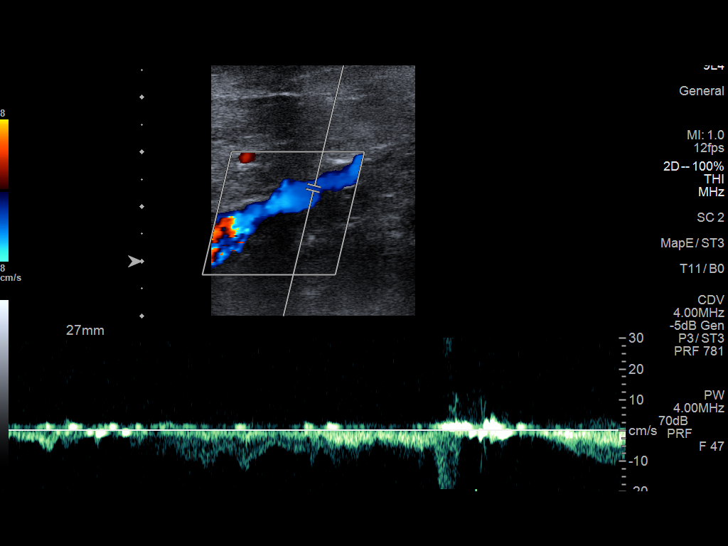
[im 17/32]
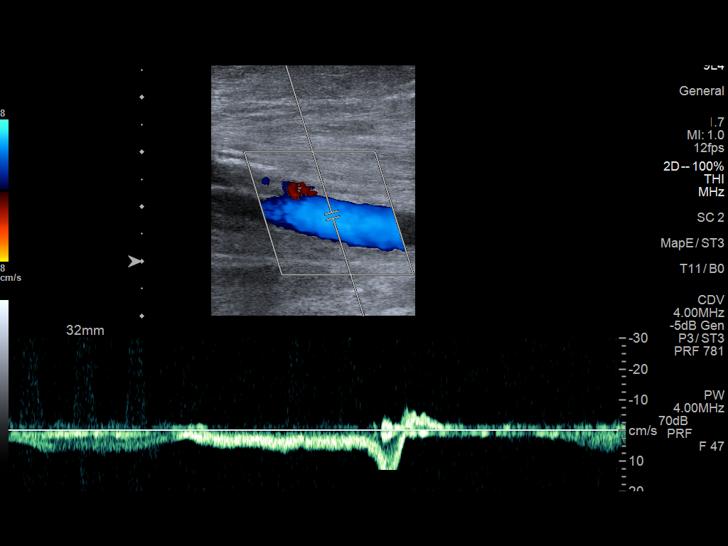
[im 18/32]
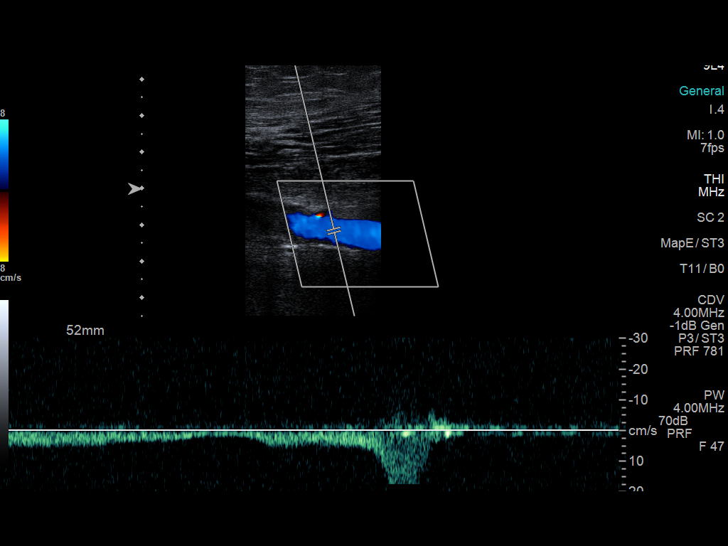
[im 21/32]
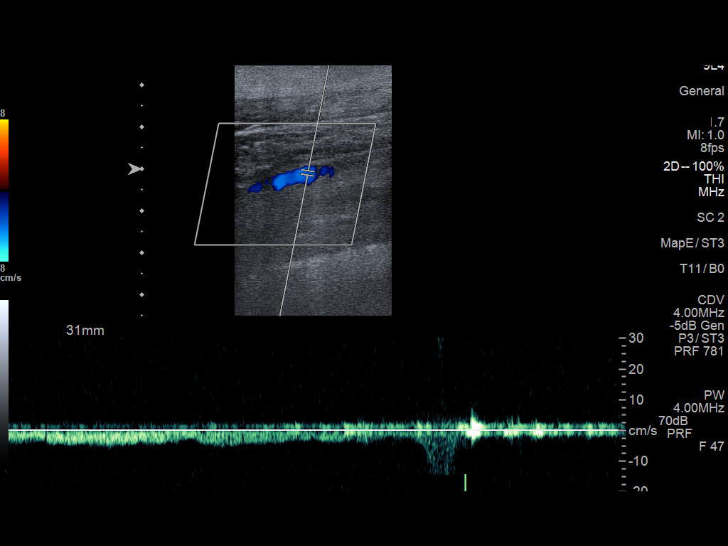
[im 23/32]
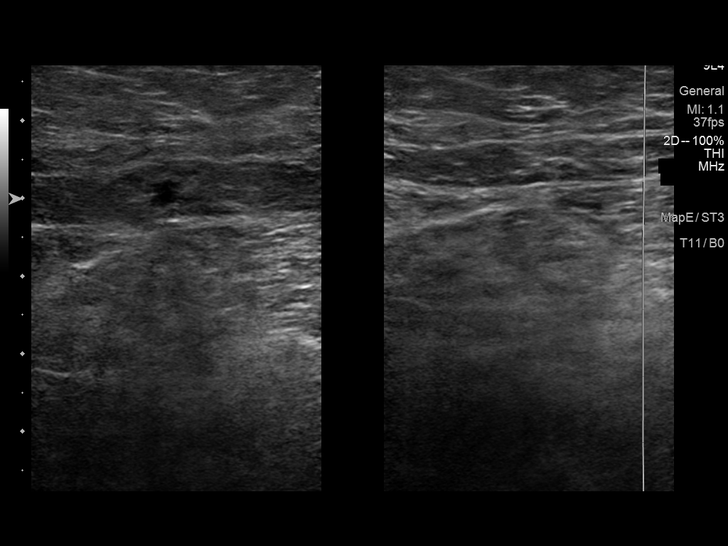
[im 26/32]
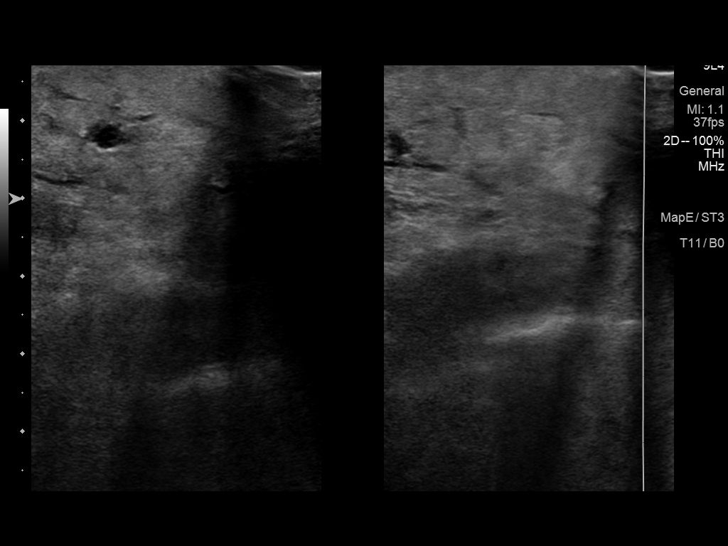
[im 29/32]
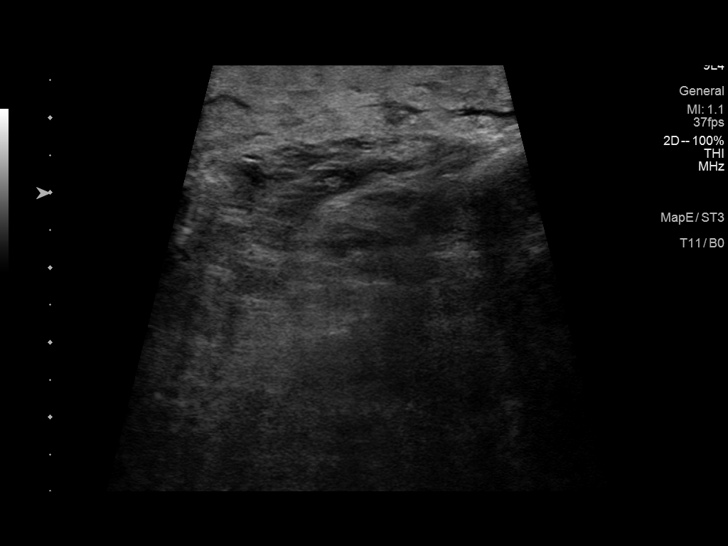
[im 32/32]
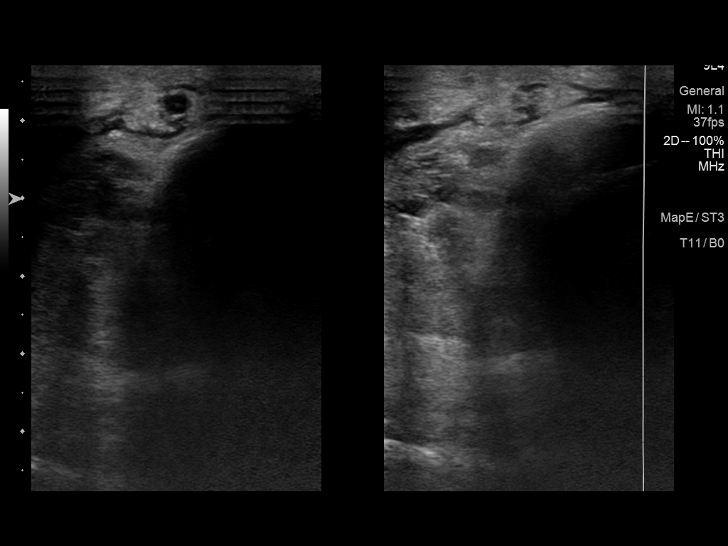

[13 of 24 positions shown; findings below may reference images not displayed]

FINDINGS: Limited exam as the examination was perform seated secondary to
patient's unwillingness to lay supine. Patient would only tolerate a
small amount of compression and augmentation.

Contralateral Common Femoral Vein: Respiratory phasicity is normal
and symmetric with the symptomatic side. No evidence of thrombus.
Normal compressibility.

Common Femoral Vein: No evidence of thrombus. Normal
compressibility, respiratory phasicity and response to augmentation.

Saphenofemoral Junction: No evidence of thrombus. Normal
compressibility and flow on color Doppler imaging.

Profunda Femoral Vein: No evidence of thrombus. Normal
compressibility and flow on color Doppler imaging.

Femoral Vein: No evidence of thrombus. Normal compressibility,
respiratory phasicity and response to augmentation.

Popliteal Vein: No evidence of thrombus. Normal compressibility,
respiratory phasicity and response to augmentation.

Calf Veins: No evidence of thrombus. Normal compressibility and flow
on color Doppler imaging.

Superficial Great Saphenous Vein: No evidence of thrombus. Normal
compressibility.

Venous Reflux:  None.

Other Findings:  None.
IMPRESSION: 1. Limited exam.  See above discussion.
2. No evidence of left lower extremity deep venous thrombosis.

## 2021-01-19 ENCOUNTER — Other Ambulatory Visit: Payer: Self-pay | Admitting: Cardiology

## 2021-02-12 DIAGNOSIS — I2510 Atherosclerotic heart disease of native coronary artery without angina pectoris: Secondary | ICD-10-CM | POA: Diagnosis not present

## 2021-02-12 DIAGNOSIS — H903 Sensorineural hearing loss, bilateral: Secondary | ICD-10-CM | POA: Diagnosis not present

## 2021-02-12 DIAGNOSIS — E7800 Pure hypercholesterolemia, unspecified: Secondary | ICD-10-CM | POA: Diagnosis not present

## 2021-02-12 DIAGNOSIS — N1832 Chronic kidney disease, stage 3b: Secondary | ICD-10-CM | POA: Diagnosis not present

## 2021-02-12 DIAGNOSIS — I10 Essential (primary) hypertension: Secondary | ICD-10-CM | POA: Diagnosis not present

## 2021-02-12 DIAGNOSIS — I872 Venous insufficiency (chronic) (peripheral): Secondary | ICD-10-CM | POA: Diagnosis not present

## 2021-02-12 DIAGNOSIS — Z1389 Encounter for screening for other disorder: Secondary | ICD-10-CM | POA: Diagnosis not present

## 2021-02-12 DIAGNOSIS — Z0000 Encounter for general adult medical examination without abnormal findings: Secondary | ICD-10-CM | POA: Diagnosis not present

## 2021-02-12 DIAGNOSIS — D649 Anemia, unspecified: Secondary | ICD-10-CM | POA: Diagnosis not present

## 2021-03-12 DIAGNOSIS — D649 Anemia, unspecified: Secondary | ICD-10-CM | POA: Diagnosis not present

## 2021-04-02 DIAGNOSIS — D649 Anemia, unspecified: Secondary | ICD-10-CM | POA: Diagnosis not present

## 2021-06-14 NOTE — Progress Notes (Signed)
Cardiology Office Note   Date:  06/18/2021   ID:  Suzanne Miller, DOB 1930/07/08, MRN [id number]  PCP:  Seward Carol, MD  Cardiologist:   Mariadelcarmen Corella Martin[place], MD   Chief Complaint  Patient presents with   Coronary Artery Disease       History of Present Illness: Suzanne Miller is a 87 y.o. female who presents for follow up of CAD. She has a history of single vessel ASCAD s/p PCI of mid RCA in 1999, HTN and dyslipidemia.   She lives alone and is able to do all her own housework. She denies any chest pain, dyspnea, palpitations. She notes BP has been high but she gets nervous when she goes to the doctor.  She brings BP readings in today and typically BP is [id number] systolic.     Past Medical History:  Diagnosis Date   Anxiety    intermittent   ASCVD (arteriosclerotic cardiovascular disease)    singel vessel s/p PCI of RCA   CAD (coronary artery disease), native coronary artery    s/p PCI of RCA   Hyperlipidemia    Hypertension    Positional vertigo    chronic   Statin intolerance    most statins and zetia    Past Surgical History:  Procedure Laterality Date   broken ankle  2000   CARDIAC CATHETERIZATION  01/17/1998   single vessel, s/p PTCA stent mid RCA    CORONARY ANGIOPLASTY     CORONARY STENT PLACEMENT       Current Outpatient Medications  Medication Sig Dispense Refill   aspirin 81 MG tablet Take 81 mg by mouth daily.      diazepam (VALIUM) 5 MG tablet Take 0.25 mg by mouth every 6 (six) hours as needed for anxiety.     Multiple Vitamins-Minerals (MULTIVITAMIN PO) Take 1 tablet by mouth daily.      vitamin B-12 (CYANOCOBALAMIN) 500 MCG tablet Take 500 mcg by mouth daily.     Vitamin D, Cholecalciferol, 1000 UNITS TABS Take by mouth.     amLODipine (NORVASC) 10 MG tablet Take 1 tablet (10 mg total) by mouth daily. 90 tablet 3   metoprolol tartrate (LOPRESSOR) 50 MG tablet Take 1.5 tablets (75 mg total) by mouth 2 (two) times daily. 270 tablet 3   pravastatin  (PRAVACHOL) 20 MG tablet Take 1 tablet (20 mg total) by mouth daily. 90 tablet 3   No current facility-administered medications for this visit.    Allergies:   Penicillins, Sulfa antibiotics, and Ticlid [ticlopidine]    Social History:  The patient  reports that she has quit smoking. She has never used smokeless tobacco. She reports that she does not drink alcohol and does not use drugs.   Family History:  The patient's family history includes Arrhythmia in her sister; Diabetes in her brother, brother, mother, and sister; Heart disease in her brother, brother, and sister; Pneumonia in her father.    ROS:  Please see the history of present illness.   Otherwise, review of systems are positive for none.   All other systems are reviewed and negative.    PHYSICAL EXAM: VS:  BP (!) 160/70   Pulse 81   Resp 20   Ht '4\' 11"'$  (1.499 m)   Wt 143 lb 9.6 oz (65.1 kg)   SpO2 97%   BMI 29.00 kg/m  , BMI Body mass index is 29 kg/m. GEN: Well nourished, well developed, in no acute distress  HEENT: normal  Neck: no JVD, carotid bruits, or masses Cardiac: RRR; no murmurs, rubs, or gallops,no edema  Respiratory:  clear to auscultation bilaterally, normal work of breathing GI: soft, nontender, nondistended, + BS MS: no deformity or atrophy  Skin: warm and dry, no rash Neuro:  Strength and sensation are intact Psych: euthymic mood, full affect   EKG:  EKG is ordered today. The ekg ordered today demonstrates NSR with HR 81. normal Ecg. I have personally reviewed and interpreted this study.    Recent Labs: No results found for requested labs within last 8760 hours.    Lipid Panel    Component Value Date/Time   CHOL 141 04/22/2015 0842   TRIG 123.0 04/22/2015 0842   HDL 37.80 (L) 04/22/2015 0842   CHOLHDL 4 04/22/2015 0842   VLDL 24.6 04/22/2015 0842   LDLCALC 79 04/22/2015 0842    Labs dated 01/20/18: cholesterol 155, triglycerides 147, HDL 43, LDL 83. Creatinine 1.35. Hgb 12.5. Other  chemistries and TSH normal. Dated 02/07/20: cholesterol 155, triglycerides 97, HDL 45, LDL 92. BUN 24, creatinine 1.6. Hgb 12.2. otherwise CMET, CBC, TSH normal. Dated 02/12/21: cholesterol 155, triglycerides 153, HDL 43, LDL 85. Creatinine 1.69. CMET otherwise normal Dated 03/12/21: Hgb 11.0  Wt Readings from Last 3 Encounters:  06/18/21 143 lb 9.6 oz (65.1 kg)  02/27/20 154 lb (69.9 kg)  03/27/19 160 lb (72.6 kg)      Other studies Reviewed: Additional studies/ records that were reviewed today include: none   ASSESSMENT AND PLAN:  1. CAD s/p remote PCI of the RCA in 1999. She is asymptomatic. She is on appropriate therapy with beta blocker, ASA, and statin.  2. Hypercholesterolemia. LDL 85. Not quite at goal of 70 but is tolerating pravastatin and did not tolerate higher doses of lipitor in the past.  3. HTN - BP is elevated today.Readings at home are pretty reasonable. On amlodipine 10 mg daily  and lopressor 75 mg bid.   4. CKD stage 3a. Stable.    Current medicines are reviewed at length with the patient today.  The patient does not have concerns regarding medicines.  The following changes have been made:  Increase metoprolol to 75 mg bid.  Labs/ tests ordered today include:   Orders Placed This Encounter  Procedures   EKG 12-Lead      Disposition:   FU with me in 1 year  Signed, Blanchie Zeleznik Martinique, MD  06/18/2021 3:45 PM    [facility] [address] [place], [place], [address] Phone [phone], Fax [phone]

## 2021-06-18 ENCOUNTER — Encounter: Payer: Self-pay | Admitting: Cardiology

## 2021-06-18 ENCOUNTER — Ambulatory Visit: Payer: Medicare Other | Admitting: Cardiology

## 2021-06-18 ENCOUNTER — Other Ambulatory Visit: Payer: Self-pay

## 2021-06-18 VITALS — BP 160/70 | HR 81 | Resp 20 | Ht 59.0 in | Wt 143.6 lb

## 2021-06-18 DIAGNOSIS — E7800 Pure hypercholesterolemia, unspecified: Secondary | ICD-10-CM

## 2021-06-18 DIAGNOSIS — I2510 Atherosclerotic heart disease of native coronary artery without angina pectoris: Secondary | ICD-10-CM

## 2021-06-18 DIAGNOSIS — I10 Essential (primary) hypertension: Secondary | ICD-10-CM | POA: Diagnosis not present

## 2021-06-18 MED ORDER — PRAVASTATIN SODIUM 20 MG PO TABS: 20.0000 mg | ORAL_TABLET | Freq: Every day | ORAL | 3 refills | Status: DC

## 2021-06-18 MED ORDER — METOPROLOL TARTRATE 50 MG PO TABS: 75.0000 mg | ORAL_TABLET | Freq: Two times a day (BID) | ORAL | 3 refills | Status: DC

## 2021-06-18 MED ORDER — AMLODIPINE BESYLATE 10 MG PO TABS: 10.0000 mg | ORAL_TABLET | Freq: Every day | ORAL | 3 refills | Status: DC

## 2021-08-12 DIAGNOSIS — H35033 Hypertensive retinopathy, bilateral: Secondary | ICD-10-CM | POA: Diagnosis not present

## 2021-08-12 DIAGNOSIS — H524 Presbyopia: Secondary | ICD-10-CM | POA: Diagnosis not present

## 2021-08-12 DIAGNOSIS — H2513 Age-related nuclear cataract, bilateral: Secondary | ICD-10-CM | POA: Diagnosis not present

## 2021-08-12 DIAGNOSIS — H35361 Drusen (degenerative) of macula, right eye: Secondary | ICD-10-CM | POA: Diagnosis not present

## 2021-08-12 DIAGNOSIS — D3131 Benign neoplasm of right choroid: Secondary | ICD-10-CM | POA: Diagnosis not present

## 2022-02-18 DIAGNOSIS — I10 Essential (primary) hypertension: Secondary | ICD-10-CM | POA: Diagnosis not present

## 2022-02-18 DIAGNOSIS — Z0000 Encounter for general adult medical examination without abnormal findings: Secondary | ICD-10-CM | POA: Diagnosis not present

## 2022-02-18 DIAGNOSIS — E7800 Pure hypercholesterolemia, unspecified: Secondary | ICD-10-CM | POA: Diagnosis not present

## 2022-02-18 DIAGNOSIS — R0989 Other specified symptoms and signs involving the circulatory and respiratory systems: Secondary | ICD-10-CM | POA: Diagnosis not present

## 2022-02-18 DIAGNOSIS — N1832 Chronic kidney disease, stage 3b: Secondary | ICD-10-CM | POA: Diagnosis not present

## 2022-02-18 DIAGNOSIS — H903 Sensorineural hearing loss, bilateral: Secondary | ICD-10-CM | POA: Diagnosis not present

## 2022-02-18 DIAGNOSIS — I2510 Atherosclerotic heart disease of native coronary artery without angina pectoris: Secondary | ICD-10-CM | POA: Diagnosis not present

## 2022-04-02 NOTE — Progress Notes (Signed)
Cardiology Office Note   Date:  04/02/2022   ID:  Suzanne Miller, DOB 10/01/1930, MRN [id number]  PCP:  Seward Carol, MD  Cardiologist:   Jalani Cullifer Martin[place], MD   No chief complaint on file.      History of Present Illness: Suzanne Miller is a 88 y.o. female who presents for follow up of CAD. She has a history of single vessel ASCAD s/p PCI of mid RCA in 1999, HTN and dyslipidemia.   She lives alone and is able to do all her own housework. She denies any chest pain, dyspnea, palpitations. She notes BP has been high but she gets nervous when she goes to the doctor.  She brings BP readings in today and typically BP is 878-676 systolic.     Past Medical History:  Diagnosis Date   Anxiety    intermittent   ASCVD (arteriosclerotic cardiovascular disease)    singel vessel s/p PCI of RCA   CAD (coronary artery disease), native coronary artery    s/p PCI of RCA   Hyperlipidemia    Hypertension    Positional vertigo    chronic   Statin intolerance    most statins and zetia    Past Surgical History:  Procedure Laterality Date   broken ankle  2000   CARDIAC CATHETERIZATION  01/17/1998   single vessel, s/p PTCA stent mid RCA    CORONARY ANGIOPLASTY     CORONARY STENT PLACEMENT       Current Outpatient Medications  Medication Sig Dispense Refill   amLODipine (NORVASC) 10 MG tablet Take 1 tablet (10 mg total) by mouth daily. 90 tablet 3   aspirin 81 MG tablet Take 81 mg by mouth daily.      diazepam (VALIUM) 5 MG tablet Take 0.25 mg by mouth every 6 (six) hours as needed for anxiety.     metoprolol tartrate (LOPRESSOR) 50 MG tablet Take 1.5 tablets (75 mg total) by mouth 2 (two) times daily. 270 tablet 3   Multiple Vitamins-Minerals (MULTIVITAMIN PO) Take 1 tablet by mouth daily.      pravastatin (PRAVACHOL) 20 MG tablet Take 1 tablet (20 mg total) by mouth daily. 90 tablet 3   vitamin B-12 (CYANOCOBALAMIN) 500 MCG tablet Take 500 mcg by mouth daily.     Vitamin D,  Cholecalciferol, 1000 UNITS TABS Take by mouth.     No current facility-administered medications for this visit.    Allergies:   Penicillins, Sulfa antibiotics, and Ticlid [ticlopidine]    Social History:  The patient  reports that she has quit smoking. She has never used smokeless tobacco. She reports that she does not drink alcohol and does not use drugs.   Family History:  The patient's family history includes Arrhythmia in her sister; Diabetes in her brother, brother, mother, and sister; Heart disease in her brother, brother, and sister; Pneumonia in her father.    ROS:  Please see the history of present illness.   Otherwise, review of systems are positive for none.   All other systems are reviewed and negative.    PHYSICAL EXAM: VS:  There were no vitals taken for this visit. , BMI There is no height or weight on file to calculate BMI. GEN: Well nourished, well developed, in no acute distress  HEENT: normal  Neck: no JVD, carotid bruits, or masses Cardiac: RRR; no murmurs, rubs, or gallops,no edema  Respiratory:  clear to auscultation bilaterally, normal work of breathing GI: soft, nontender, nondistended, +  BS MS: no deformity or atrophy  Skin: warm and dry, no rash Neuro:  Strength and sensation are intact Psych: euthymic mood, full affect   EKG:  EKG is ordered today. The ekg ordered today demonstrates NSR with HR 81. normal Ecg. I have personally reviewed and interpreted this study.    Recent Labs: No results found for requested labs within last 365 days.    Lipid Panel    Component Value Date/Time   CHOL 141 04/22/2015 0842   TRIG 123.0 04/22/2015 0842   HDL 37.80 (L) 04/22/2015 0842   CHOLHDL 4 04/22/2015 0842   VLDL 24.6 04/22/2015 0842   LDLCALC 79 04/22/2015 0842    Labs dated 01/20/18: cholesterol 155, triglycerides 147, HDL 43, LDL 83. Creatinine 1.35. Hgb 12.5. Other chemistries and TSH normal. Dated 02/07/20: cholesterol 155, triglycerides 97, HDL 45,  LDL 92. BUN 24, creatinine 1.6. Hgb 12.2. otherwise CMET, CBC, TSH normal. Dated 02/12/21: cholesterol 155, triglycerides 153, HDL 43, LDL 85. Creatinine 1.69. CMET otherwise normal Dated 03/12/21: Hgb 11.0 Dated 02/18/22: cholesterol 165, triglycerides 135, HDL 43, LDL 98. Creatinine 1.78. Hgb 11.9. Otherwise CBC, CMET and TSH normal.  Wt Readings from Last 3 Encounters:  06/18/21 143 lb 9.6 oz (65.1 kg)  02/27/20 154 lb (69.9 kg)  03/27/19 160 lb (72.6 kg)      Other studies Reviewed: Additional studies/ records that were reviewed today include: none   ASSESSMENT AND PLAN:  1. CAD s/p remote PCI of the RCA in 1999. She is asymptomatic. She is on appropriate therapy with beta blocker, ASA, and statin.  2. Hypercholesterolemia. LDL 85. Not quite at goal of 70 but is tolerating pravastatin and did not tolerate higher doses of lipitor in the past.  3. HTN - BP is elevated today.Readings at home are pretty reasonable. On amlodipine 10 mg daily  and lopressor 75 mg bid.   4. CKD stage 3a. Stable.    Current medicines are reviewed at length with the patient today.  The patient does not have concerns regarding medicines.  The following changes have been made:  Increase metoprolol to 75 mg bid.  Labs/ tests ordered today include:   No orders of the defined types were placed in this encounter.     Disposition:   FU with me in 1 year  Signed, Maliah Pyles [place], MD  04/02/2022 2:42 PM    [facility] [address], [place], [place], [address] Phone [phone], Fax [phone]

## 2022-04-06 ENCOUNTER — Ambulatory Visit: Payer: Medicare Other | Admitting: Cardiology

## 2022-04-06 ENCOUNTER — Encounter: Payer: Self-pay | Admitting: Cardiology

## 2022-04-06 VITALS — BP 160/60 | HR 72 | Ht 59.0 in | Wt 142.0 lb

## 2022-04-06 DIAGNOSIS — I10 Essential (primary) hypertension: Secondary | ICD-10-CM | POA: Diagnosis not present

## 2022-04-06 DIAGNOSIS — I2510 Atherosclerotic heart disease of native coronary artery without angina pectoris: Secondary | ICD-10-CM | POA: Diagnosis not present

## 2022-04-06 DIAGNOSIS — E7800 Pure hypercholesterolemia, unspecified: Secondary | ICD-10-CM

## 2022-04-06 MED ORDER — PRAVASTATIN SODIUM 20 MG PO TABS: 20.0000 mg | ORAL_TABLET | Freq: Every day | ORAL | 3 refills | Status: DC

## 2022-04-06 MED ORDER — METOPROLOL TARTRATE 50 MG PO TABS: 75.0000 mg | ORAL_TABLET | Freq: Two times a day (BID) | ORAL | 3 refills | Status: DC

## 2022-04-06 MED ORDER — AMLODIPINE BESYLATE 10 MG PO TABS: 10.0000 mg | ORAL_TABLET | Freq: Every day | ORAL | 3 refills | Status: DC

## 2022-04-06 NOTE — Patient Instructions (Signed)
  Follow-Up: At [facility], you and your health needs are our priority.  As part of our continuing mission to provide you with exceptional heart care, we have created designated Provider Care Teams.  These Care Teams include your primary Cardiologist (physician) and Advanced Practice Providers (APPs -  Physician Assistants and Nurse Practitioners) who all work together to provide you with the care you need, when you need it.  We recommend signing up for the patient portal called "MyChart".  Sign up information is provided on this After Visit Summary.  MyChart is used to connect with patients for Virtual Visits (Telemedicine).  Patients are able to view lab/test results, encounter notes, upcoming appointments, etc.  Non-urgent messages can be sent to your provider as well.   To learn more about what you can do with MyChart, go to [email or web address].    Your next appointment:   12 month(s)  The format for your next appointment:   In Person  Provider:   PETER Martinique MD      Important Information About Sugar

## 2023-02-22 DIAGNOSIS — N1832 Chronic kidney disease, stage 3b: Secondary | ICD-10-CM | POA: Diagnosis not present

## 2023-02-22 DIAGNOSIS — Z0000 Encounter for general adult medical examination without abnormal findings: Secondary | ICD-10-CM | POA: Diagnosis not present

## 2023-02-22 DIAGNOSIS — I872 Venous insufficiency (chronic) (peripheral): Secondary | ICD-10-CM | POA: Diagnosis not present

## 2023-02-22 DIAGNOSIS — E7800 Pure hypercholesterolemia, unspecified: Secondary | ICD-10-CM | POA: Diagnosis not present

## 2023-02-22 DIAGNOSIS — I2510 Atherosclerotic heart disease of native coronary artery without angina pectoris: Secondary | ICD-10-CM | POA: Diagnosis not present

## 2023-02-22 DIAGNOSIS — I10 Essential (primary) hypertension: Secondary | ICD-10-CM | POA: Diagnosis not present

## 2023-04-07 ENCOUNTER — Ambulatory Visit: Payer: Medicare Other | Admitting: Physician Assistant

## 2023-04-07 ENCOUNTER — Ambulatory Visit: Payer: Medicare Other | Attending: Physician Assistant | Admitting: Nurse Practitioner

## 2023-04-07 ENCOUNTER — Encounter: Payer: Self-pay | Admitting: Nurse Practitioner

## 2023-04-07 VITALS — BP 144/88 | HR 93 | Ht 59.0 in | Wt 133.0 lb

## 2023-04-07 DIAGNOSIS — I2510 Atherosclerotic heart disease of native coronary artery without angina pectoris: Secondary | ICD-10-CM

## 2023-04-07 DIAGNOSIS — I10 Essential (primary) hypertension: Secondary | ICD-10-CM | POA: Diagnosis not present

## 2023-04-07 DIAGNOSIS — N1831 Chronic kidney disease, stage 3a: Secondary | ICD-10-CM

## 2023-04-07 DIAGNOSIS — E785 Hyperlipidemia, unspecified: Secondary | ICD-10-CM

## 2023-04-07 NOTE — Progress Notes (Signed)
Office Visit    Patient Name: Suzanne Miller Date of Encounter: 04/07/2023  Primary Care Provider:  Renford Dills, MD Primary Cardiologist:  Peter Swaziland, MD  Chief Complaint    89 year old female with a history of CAD s/p PCI-mid RCA in 1999, hypertension, hyperlipidemia, and CKD stage III who presents for follow-up related to CAD.  Past Medical History    Past Medical History:  Diagnosis Date   Anxiety    intermittent   ASCVD (arteriosclerotic cardiovascular disease)    singel vessel s/p PCI of RCA   CAD (coronary artery disease), native coronary artery    s/p PCI of RCA   Hyperlipidemia    Hypertension    Positional vertigo    chronic   Statin intolerance    most statins and zetia   Past Surgical History:  Procedure Laterality Date   broken ankle  2000   CARDIAC CATHETERIZATION  01/17/1998   single vessel, s/p PTCA stent mid RCA    CORONARY ANGIOPLASTY     CORONARY STENT PLACEMENT      Allergies  Allergies  Allergen Reactions   Penicillins     Has patient had a PCN reaction causing immediate rash, facial/tongue/throat swelling, SOB or lightheadedness with hypotension:YES Has patient had a PCN reaction causing severe rash involving mucus membranes or skin necrosis: NO Has patient had a PCN reaction that required hospitalization NO Has patient had a PCN reaction occurring within the last 10 years:NO If all of the above answers are "NO", then may proceed with Cephalosporin use.   Sulfa Antibiotics     Rash    Ticlid [Ticlopidine]     Rash      Labs/Other Studies Reviewed    The following studies were reviewed today:     Recent Labs: No results found for requested labs within last 365 days.  Recent Lipid Panel    Component Value Date/Time   CHOL 141 04/22/2015 0842   TRIG 123.0 04/22/2015 0842   HDL 37.80 (L) 04/22/2015 0842   CHOLHDL 4 04/22/2015 0842   VLDL 24.6 04/22/2015 0842   LDLCALC 79 04/22/2015 0842    History of Present Illness     89 year old female with the above past medical history including CAD s/p PCI-mid RCA in 1999, hypertension, hyperlipidemia, and CKD stage III.  She has a history of CAD with remote PCI to the RCA in 1999.  Additionally, she has a history of statin intolerance and has only tolerated low-dose pravastatin.  She was last seen in the office on 04/06/2022 and was stable from a cardiac standpoint.  She denied symptoms concerning for angina but did note generalized fatigue.  BP was mildly elevated in the setting of whitecoat syndrome.  She presents today for follow-up.  Since her last visit she has done well from a cardiac standpoint.  She denies any symptoms concerning for angina.  BP has been stable. Overall, she reports feeling well.    Home Medications    Current Outpatient Medications  Medication Sig Dispense Refill   amLODipine (NORVASC) 10 MG tablet Take 1 tablet (10 mg total) by mouth daily. 90 tablet 3   aspirin 81 MG tablet Take 81 mg by mouth daily.      diazepam (VALIUM) 5 MG tablet Take 0.25 mg by mouth every 6 (six) hours as needed for anxiety.     metoprolol tartrate (LOPRESSOR) 50 MG tablet Take 1.5 tablets (75 mg total) by mouth 2 (two) times daily. 270 tablet 3  Multiple Vitamins-Minerals (MULTIVITAMIN PO) Take 1 tablet by mouth daily.      pravastatin (PRAVACHOL) 20 MG tablet Take 1 tablet (20 mg total) by mouth daily. 90 tablet 3   vitamin B-12 (CYANOCOBALAMIN) 500 MCG tablet Take 500 mcg by mouth daily.     Vitamin D, Cholecalciferol, 1000 UNITS TABS Take by mouth.     No current facility-administered medications for this visit.     Review of Systems    She denies chest pain, palpitations, dyspnea, pnd, orthopnea, n, v, dizziness, syncope, edema, weight gain, or early satiety. All other systems reviewed and are otherwise negative except as noted above.   Physical Exam    VS:  BP (!) 144/88   Pulse 93   Ht 4\' 11"  (1.499 m)   Wt 133 lb (60.3 kg)   SpO2 100%   BMI 26.86  kg/m  GEN: Well nourished, well developed, in no acute distress. HEENT: normal. Neck: Supple, no JVD, carotid bruits, or masses. Cardiac: RRR, no murmurs, rubs, or gallops. No clubbing, cyanosis, edema.  Radials/DP/PT 2+ and equal bilaterally.  Respiratory:  Respirations regular and unlabored, clear to auscultation bilaterally. GI: Soft, nontender, nondistended, BS + x 4. MS: no deformity or atrophy. Skin: warm and dry, no rash. Neuro:  Strength and sensation are intact. Psych: Normal affect.  Accessory Clinical Findings    ECG personally reviewed by me today -NSR, 93 bpm - no acute changes.   Lab Results  Component Value Date   WBC 3.9 (L) 07/07/2016   HGB 12.6 07/07/2016   HCT 39.1 07/07/2016   MCV 91.6 07/07/2016   PLT 195 07/07/2016   Lab Results  Component Value Date   CREATININE 1.29 (H) 07/07/2016   BUN 18 07/07/2016   NA 140 07/07/2016   K 4.0 07/07/2016   CL 108 07/07/2016   CO2 23 07/07/2016   Lab Results  Component Value Date   ALT 24 04/22/2015   AST 25 04/22/2015   ALKPHOS 75 04/22/2015   BILITOT 0.5 04/22/2015   Lab Results  Component Value Date   CHOL 141 04/22/2015   HDL 37.80 (L) 04/22/2015   LDLCALC 79 04/22/2015   TRIG 123.0 04/22/2015   CHOLHDL 4 04/22/2015    No results found for: "HGBA1C"  Assessment & Plan    1. CAD: S/p PCI-mid RCA in 1999. Stable with no anginal symptoms. No indication for ischemic evaluation.  Continue aspirin, metoprolol, amlodipine, pravastatin.  2. Hypertension: BP well controlled. Continue current antihypertensive regimen.   3. Hyperlipidemia: LDL was 90 01/2023.  Continue pravastatin.  4. CKD stage IIIa: Creatinine was stable at 2.04 in 01/2023.  Avoid NSAIDs, maintain hydration.  5. Disposition: Follow-up in 1 year, sooner if needed.    Joylene Grapes, NP 04/07/2023, 3:11 PM

## 2023-04-07 NOTE — Patient Instructions (Signed)
Medication Instructions:  Your physician recommends that you continue on your current medications as directed. Please refer to the Current Medication list given to you today.  *If you need a refill on your cardiac medications before your next appointment, please call your pharmacy*   Lab Work: NONE ordered at this time of appointment   Testing/Procedures: NONE ordered at this time of appointment     Follow-Up: At [facility], you and your health needs are our priority.  As part of our continuing mission to provide you with exceptional heart care, we have created designated Provider Care Teams.  These Care Teams include your primary Cardiologist (physician) and Advanced Practice Providers (APPs -  Physician Assistants and Nurse Practitioners) who all work together to provide you with the care you need, when you need it.  We recommend signing up for the patient portal called "MyChart".  Sign up information is provided on this After Visit Summary.  MyChart is used to connect with patients for Virtual Visits (Telemedicine).  Patients are able to view lab/test results, encounter notes, upcoming appointments, etc.  Non-urgent messages can be sent to your provider as well.   To learn more about what you can do with MyChart, go to [email or web address].    Your next appointment:   1 year(s)  Provider:   Peter Swazil[place], MD     Other Instructions

## 2023-07-18 ENCOUNTER — Emergency Department (HOSPITAL_COMMUNITY): Payer: Medicare Other

## 2023-07-18 ENCOUNTER — Inpatient Hospital Stay (HOSPITAL_COMMUNITY)
Admission: EM | Admit: 2023-07-18 | Discharge: 2023-08-26 | DRG: 308 | Disposition: E | Payer: Medicare Other | Attending: Family Medicine | Admitting: Family Medicine

## 2023-07-18 ENCOUNTER — Other Ambulatory Visit: Payer: Self-pay

## 2023-07-18 ENCOUNTER — Encounter (HOSPITAL_COMMUNITY): Payer: Self-pay | Admitting: Emergency Medicine

## 2023-07-18 DIAGNOSIS — Z7982 Long term (current) use of aspirin: Secondary | ICD-10-CM

## 2023-07-18 DIAGNOSIS — E872 Acidosis, unspecified: Secondary | ICD-10-CM | POA: Diagnosis present

## 2023-07-18 DIAGNOSIS — I361 Nonrheumatic tricuspid (valve) insufficiency: Secondary | ICD-10-CM | POA: Diagnosis not present

## 2023-07-18 DIAGNOSIS — Z66 Do not resuscitate: Secondary | ICD-10-CM | POA: Diagnosis present

## 2023-07-18 DIAGNOSIS — D72829 Elevated white blood cell count, unspecified: Secondary | ICD-10-CM | POA: Diagnosis not present

## 2023-07-18 DIAGNOSIS — R8290 Unspecified abnormal findings in urine: Secondary | ICD-10-CM | POA: Diagnosis not present

## 2023-07-18 DIAGNOSIS — Z9181 History of falling: Secondary | ICD-10-CM

## 2023-07-18 DIAGNOSIS — Z888 Allergy status to other drugs, medicaments and biological substances status: Secondary | ICD-10-CM

## 2023-07-18 DIAGNOSIS — F419 Anxiety disorder, unspecified: Secondary | ICD-10-CM | POA: Diagnosis present

## 2023-07-18 DIAGNOSIS — E7800 Pure hypercholesterolemia, unspecified: Secondary | ICD-10-CM | POA: Diagnosis not present

## 2023-07-18 DIAGNOSIS — I130 Hypertensive heart and chronic kidney disease with heart failure and stage 1 through stage 4 chronic kidney disease, or unspecified chronic kidney disease: Secondary | ICD-10-CM | POA: Diagnosis present

## 2023-07-18 DIAGNOSIS — L89152 Pressure ulcer of sacral region, stage 2: Secondary | ICD-10-CM | POA: Diagnosis present

## 2023-07-18 DIAGNOSIS — E785 Hyperlipidemia, unspecified: Secondary | ICD-10-CM | POA: Diagnosis present

## 2023-07-18 DIAGNOSIS — F411 Generalized anxiety disorder: Secondary | ICD-10-CM | POA: Diagnosis not present

## 2023-07-18 DIAGNOSIS — Z5329 Procedure and treatment not carried out because of patient's decision for other reasons: Secondary | ICD-10-CM | POA: Diagnosis not present

## 2023-07-18 DIAGNOSIS — J984 Other disorders of lung: Secondary | ICD-10-CM | POA: Diagnosis not present

## 2023-07-18 DIAGNOSIS — I4891 Unspecified atrial fibrillation: Secondary | ICD-10-CM | POA: Diagnosis not present

## 2023-07-18 DIAGNOSIS — H9193 Unspecified hearing loss, bilateral: Secondary | ICD-10-CM | POA: Diagnosis not present

## 2023-07-18 DIAGNOSIS — D696 Thrombocytopenia, unspecified: Secondary | ICD-10-CM | POA: Diagnosis present

## 2023-07-18 DIAGNOSIS — N1832 Chronic kidney disease, stage 3b: Secondary | ICD-10-CM | POA: Diagnosis present

## 2023-07-18 DIAGNOSIS — E7801 Familial hypercholesterolemia: Secondary | ICD-10-CM | POA: Diagnosis not present

## 2023-07-18 DIAGNOSIS — N179 Acute kidney failure, unspecified: Secondary | ICD-10-CM | POA: Diagnosis not present

## 2023-07-18 DIAGNOSIS — R6889 Other general symptoms and signs: Secondary | ICD-10-CM | POA: Diagnosis not present

## 2023-07-18 DIAGNOSIS — I10 Essential (primary) hypertension: Secondary | ICD-10-CM | POA: Diagnosis not present

## 2023-07-18 DIAGNOSIS — I499 Cardiac arrhythmia, unspecified: Secondary | ICD-10-CM | POA: Diagnosis not present

## 2023-07-18 DIAGNOSIS — Z882 Allergy status to sulfonamides status: Secondary | ICD-10-CM

## 2023-07-18 DIAGNOSIS — Z515 Encounter for palliative care: Secondary | ICD-10-CM | POA: Diagnosis not present

## 2023-07-18 DIAGNOSIS — I2510 Atherosclerotic heart disease of native coronary artery without angina pectoris: Secondary | ICD-10-CM | POA: Diagnosis not present

## 2023-07-18 DIAGNOSIS — D631 Anemia in chronic kidney disease: Secondary | ICD-10-CM | POA: Insufficient documentation

## 2023-07-18 DIAGNOSIS — Z955 Presence of coronary angioplasty implant and graft: Secondary | ICD-10-CM

## 2023-07-18 DIAGNOSIS — E876 Hypokalemia: Secondary | ICD-10-CM | POA: Diagnosis present

## 2023-07-18 DIAGNOSIS — Z7189 Other specified counseling: Secondary | ICD-10-CM | POA: Diagnosis not present

## 2023-07-18 DIAGNOSIS — I509 Heart failure, unspecified: Secondary | ICD-10-CM

## 2023-07-18 DIAGNOSIS — N3000 Acute cystitis without hematuria: Secondary | ICD-10-CM | POA: Diagnosis present

## 2023-07-18 DIAGNOSIS — Z87891 Personal history of nicotine dependence: Secondary | ICD-10-CM

## 2023-07-18 DIAGNOSIS — R0989 Other specified symptoms and signs involving the circulatory and respiratory systems: Secondary | ICD-10-CM | POA: Diagnosis not present

## 2023-07-18 DIAGNOSIS — Z602 Problems related to living alone: Secondary | ICD-10-CM | POA: Diagnosis present

## 2023-07-18 DIAGNOSIS — R0902 Hypoxemia: Secondary | ICD-10-CM | POA: Diagnosis not present

## 2023-07-18 DIAGNOSIS — R54 Age-related physical debility: Secondary | ICD-10-CM | POA: Diagnosis not present

## 2023-07-18 DIAGNOSIS — I700 Atherosclerosis of aorta: Secondary | ICD-10-CM | POA: Diagnosis not present

## 2023-07-18 DIAGNOSIS — R000 Tachycardia, unspecified: Secondary | ICD-10-CM | POA: Diagnosis not present

## 2023-07-18 DIAGNOSIS — G9341 Metabolic encephalopathy: Secondary | ICD-10-CM | POA: Diagnosis not present

## 2023-07-18 DIAGNOSIS — J9601 Acute respiratory failure with hypoxia: Secondary | ICD-10-CM | POA: Diagnosis not present

## 2023-07-18 DIAGNOSIS — J90 Pleural effusion, not elsewhere classified: Secondary | ICD-10-CM | POA: Diagnosis not present

## 2023-07-18 DIAGNOSIS — Z880 Allergy status to penicillin: Secondary | ICD-10-CM

## 2023-07-18 DIAGNOSIS — I5033 Acute on chronic diastolic (congestive) heart failure: Secondary | ICD-10-CM | POA: Insufficient documentation

## 2023-07-18 DIAGNOSIS — E8721 Acute metabolic acidosis: Secondary | ICD-10-CM | POA: Diagnosis not present

## 2023-07-18 DIAGNOSIS — N189 Chronic kidney disease, unspecified: Secondary | ICD-10-CM | POA: Diagnosis present

## 2023-07-18 DIAGNOSIS — E860 Dehydration: Secondary | ICD-10-CM | POA: Diagnosis not present

## 2023-07-18 DIAGNOSIS — L8990 Pressure ulcer of unspecified site, unspecified stage: Secondary | ICD-10-CM | POA: Insufficient documentation

## 2023-07-18 DIAGNOSIS — H9190 Unspecified hearing loss, unspecified ear: Secondary | ICD-10-CM

## 2023-07-18 DIAGNOSIS — I5032 Chronic diastolic (congestive) heart failure: Secondary | ICD-10-CM | POA: Diagnosis not present

## 2023-07-18 DIAGNOSIS — R680 Hypothermia, not associated with low environmental temperature: Secondary | ICD-10-CM | POA: Diagnosis not present

## 2023-07-18 DIAGNOSIS — J9811 Atelectasis: Secondary | ICD-10-CM | POA: Diagnosis not present

## 2023-07-18 DIAGNOSIS — N184 Chronic kidney disease, stage 4 (severe): Secondary | ICD-10-CM | POA: Diagnosis not present

## 2023-07-18 DIAGNOSIS — Z79899 Other long term (current) drug therapy: Secondary | ICD-10-CM

## 2023-07-18 DIAGNOSIS — Z8249 Family history of ischemic heart disease and other diseases of the circulatory system: Secondary | ICD-10-CM

## 2023-07-18 DIAGNOSIS — R918 Other nonspecific abnormal finding of lung field: Secondary | ICD-10-CM | POA: Diagnosis not present

## 2023-07-18 DIAGNOSIS — Z743 Need for continuous supervision: Secondary | ICD-10-CM | POA: Diagnosis not present

## 2023-07-18 DIAGNOSIS — U071 COVID-19: Secondary | ICD-10-CM | POA: Diagnosis present

## 2023-07-18 DIAGNOSIS — R627 Adult failure to thrive: Secondary | ICD-10-CM | POA: Diagnosis present

## 2023-07-18 LAB — URINALYSIS, ROUTINE W REFLEX MICROSCOPIC
Bilirubin Urine: NEGATIVE
Glucose, UA: 50 mg/dL — AB
Ketones, ur: 5 mg/dL — AB
Nitrite: NEGATIVE
Protein, ur: 100 mg/dL — AB
Specific Gravity, Urine: 1.014 (ref 1.005–1.030)
WBC, UA: >50 WBC/hpf (ref 0–5)
pH: 5 (ref 5.0–8.0)

## 2023-07-18 LAB — CBC
HCT: 37.7 % (ref 36.0–46.0)
Hemoglobin: 12.1 g/dL (ref 12.0–15.0)
MCH: 29.4 pg (ref 26.0–34.0)
MCHC: 32.1 g/dL (ref 30.0–36.0)
MCV: 91.5 fL (ref 80.0–100.0)
Platelets: 172 10*3/uL (ref 150–400)
RBC: 4.12 MIL/uL (ref 3.87–5.11)
RDW: 12.4 % (ref 11.5–15.5)
WBC: 12.1 10*3/uL — ABNORMAL HIGH (ref 4.0–10.5)
nRBC: 0 % (ref 0.0–0.2)

## 2023-07-18 LAB — BASIC METABOLIC PANEL
Anion gap: 22 — ABNORMAL HIGH (ref 5–15)
BUN: 93 mg/dL — ABNORMAL HIGH (ref 8–23)
CO2: 18 mmol/L — ABNORMAL LOW (ref 22–32)
Calcium: 9.5 mg/dL (ref 8.9–10.3)
Chloride: 101 mmol/L (ref 98–111)
Creatinine, Ser: 3.74 mg/dL — ABNORMAL HIGH (ref 0.44–1.00)
GFR, Estimated: 11 mL/min — ABNORMAL LOW (ref >60)
Glucose, Bld: 172 mg/dL — ABNORMAL HIGH (ref 70–99)
Potassium: 2.9 mmol/L — ABNORMAL LOW (ref 3.5–5.1)
Sodium: 141 mmol/L (ref 135–145)

## 2023-07-18 LAB — RESP PANEL BY RT-PCR (RSV, FLU A&B, COVID)  RVPGX2
Influenza A by PCR: NEGATIVE
Influenza B by PCR: NEGATIVE
Resp Syncytial Virus by PCR: NEGATIVE
SARS Coronavirus 2 by RT PCR: POSITIVE — AB

## 2023-07-18 LAB — BRAIN NATRIURETIC PEPTIDE: B Natriuretic Peptide: 337.8 pg/mL — ABNORMAL HIGH (ref 0.0–100.0)

## 2023-07-18 LAB — TSH: TSH: 1.107 u[IU]/mL (ref 0.350–4.500)

## 2023-07-18 LAB — CREATININE, URINE, RANDOM: Creatinine, Urine: 117 mg/dL

## 2023-07-18 LAB — SODIUM, URINE, RANDOM: Sodium, Ur: <10 mmol/L

## 2023-07-18 LAB — MAGNESIUM: Magnesium: 2.6 mg/dL — ABNORMAL HIGH (ref 1.7–2.4)

## 2023-07-18 MED ORDER — METOPROLOL TARTRATE 50 MG PO TABS
75.0000 mg | ORAL_TABLET | Freq: Two times a day (BID) | ORAL | Status: DC
  Administered 2023-07-18 – 2023-07-21 (×3): 75 mg via ORAL
  Filled 2023-07-18 (×8): qty 1

## 2023-07-18 MED ORDER — SODIUM CHLORIDE 0.9 % IV SOLN
1.0000 g | INTRAVENOUS | Status: AC
  Administered 2023-07-18 – 2023-07-22 (×5): 1 g via INTRAVENOUS
  Filled 2023-07-18 (×5): qty 10

## 2023-07-18 MED ORDER — POTASSIUM CHLORIDE 20 MEQ PO PACK
20.0000 meq | PACK | Freq: Two times a day (BID) | ORAL | Status: AC
  Administered 2023-07-18 – 2023-07-19 (×2): 20 meq via ORAL
  Filled 2023-07-18 (×2): qty 1

## 2023-07-18 MED ORDER — ALBUTEROL SULFATE (2.5 MG/3ML) 0.083% IN NEBU: 2.5000 mg | INHALATION_SOLUTION | Freq: Four times a day (QID) | RESPIRATORY_TRACT | Status: DC | PRN

## 2023-07-18 MED ORDER — POTASSIUM CHLORIDE CRYS ER 20 MEQ PO TBCR
60.0000 meq | EXTENDED_RELEASE_TABLET | ORAL | Status: DC
  Filled 2023-07-18: qty 3

## 2023-07-18 MED ORDER — GUAIFENESIN ER 600 MG PO TB12
600.0000 mg | ORAL_TABLET | Freq: Two times a day (BID) | ORAL | Status: DC
  Administered 2023-07-18 – 2023-07-23 (×5): 600 mg via ORAL
  Filled 2023-07-18 (×8): qty 1

## 2023-07-18 MED ORDER — DILTIAZEM HCL-DEXTROSE 125-5 MG/125ML-% IV SOLN (PREMIX)
5.0000 mg/h | INTRAVENOUS | Status: DC
  Administered 2023-07-18: 5 mg/h via INTRAVENOUS
  Administered 2023-07-18 – 2023-07-21 (×10): 15 mg/h via INTRAVENOUS
  Administered 2023-07-22: 12.5 mg/h via INTRAVENOUS
  Filled 2023-07-18 (×12): qty 125

## 2023-07-18 MED ORDER — POTASSIUM CHLORIDE 20 MEQ PO PACK
20.0000 meq | PACK | Freq: Two times a day (BID) | ORAL | Status: DC
  Administered 2023-07-18: 20 meq via ORAL
  Filled 2023-07-18: qty 1

## 2023-07-18 MED ORDER — ACETAMINOPHEN 650 MG RE SUPP: 650.0000 mg | Freq: Four times a day (QID) | RECTAL | Status: DC | PRN

## 2023-07-18 MED ORDER — PRAVASTATIN SODIUM 10 MG PO TABS
20.0000 mg | ORAL_TABLET | Freq: Every day | ORAL | Status: DC
  Administered 2023-07-18 – 2023-07-22 (×4): 20 mg via ORAL
  Filled 2023-07-18 (×7): qty 2

## 2023-07-18 MED ORDER — ACETAMINOPHEN 325 MG PO TABS
650.0000 mg | ORAL_TABLET | Freq: Four times a day (QID) | ORAL | Status: DC | PRN
  Administered 2023-07-22: 650 mg via ORAL
  Filled 2023-07-18 (×2): qty 2

## 2023-07-18 MED ORDER — SODIUM CHLORIDE 0.9 % IV BOLUS
1000.0000 mL | Freq: Once | INTRAVENOUS | Status: AC
  Administered 2023-07-18: 1000 mL via INTRAVENOUS

## 2023-07-18 MED ORDER — METOPROLOL TARTRATE 25 MG PO TABS
25.0000 mg | ORAL_TABLET | Freq: Two times a day (BID) | ORAL | Status: DC
  Administered 2023-07-18: 25 mg via ORAL
  Filled 2023-07-18: qty 1

## 2023-07-18 MED ORDER — SODIUM CHLORIDE 0.9% FLUSH
3.0000 mL | Freq: Two times a day (BID) | INTRAVENOUS | Status: DC
  Administered 2023-07-20 – 2023-07-25 (×6): 3 mL via INTRAVENOUS

## 2023-07-18 MED ORDER — ADULT MULTIVITAMIN W/MINERALS CH
1.0000 | ORAL_TABLET | Freq: Every day | ORAL | Status: DC
  Administered 2023-07-18 – 2023-07-20 (×2): 1 via ORAL
  Filled 2023-07-18 (×5): qty 1

## 2023-07-18 MED ORDER — HEPARIN SODIUM (PORCINE) 5000 UNIT/ML IJ SOLN
5000.0000 [IU] | Freq: Three times a day (TID) | INTRAMUSCULAR | Status: DC
  Administered 2023-07-18 – 2023-07-19 (×3): 5000 [IU] via SUBCUTANEOUS
  Filled 2023-07-18 (×3): qty 1

## 2023-07-18 MED ORDER — DIAZEPAM 2 MG PO TABS
2.0000 mg | ORAL_TABLET | Freq: Four times a day (QID) | ORAL | Status: DC | PRN
  Administered 2023-07-22: 2 mg via ORAL
  Filled 2023-07-18 (×2): qty 1

## 2023-07-18 MED ORDER — DILTIAZEM LOAD VIA INFUSION
20.0000 mg | Freq: Once | INTRAVENOUS | Status: AC
  Administered 2023-07-18: 20 mg via INTRAVENOUS
  Filled 2023-07-18: qty 20

## 2023-07-18 NOTE — Consult Note (Addendum)
Cardiology Consultation   Patient ID: [name] MRN: [id number]; DOB: [birth year]  Admit date: 07/16/2023 Date of Consult: 07/05/2023  PCP:  Renford Dills, MD   [facility] Providers Cardiologist:  Peter Swaziland, MD   {    Patient Profile:   Suzanne Miller is a 89 y.o. female with a hx of CAD s/p PCI to RCA 1999, HTN, HLD, CKD II,  with  who is being seen 07/24/2023 for the evaluation of A fib at the request of Dr Jeraldine Loots.  History of Present Illness:   Suzanne Miller with above PMH who presented to ER today for fast heart rate. She states her daughter sent her here and she has no idea why. She is upset that she is here in the hospital and wants to go home. She has significant hearing loss. She has some congestion and cough recently, denied any fever, chills, abdominal pain, diarrhea, vomiting.  She further denies any chest pain, dizziness, syncope, heart palpitation.  She states she get short of breath when she is upset, like right now.  She lives alone, her nephew comes to visit her and help her with ADLs.  She denied history of MI or CVA.   Per chart review, she follows Dr. Swaziland, had CAD requiring PCI to mid RCA in 1999.  She has history of statin intolerance, historically tolerated low-dose pravastatin.  She was last seen by Irving Burton manage NP 04/07/2023, reports doing well from cardiac standpoint, no symptoms concerning for angina, blood pressure was stable.  She was maintained on aspirin, metoprolol, amlodipine, and pravastatin for medical therapy.  Diagnostic workup today revealed severe hypokalemia 2.9, bicarb 18, glucose 172, BUN 93, creatinine 3.74, anion gap 22, magnesium 2.6, GFR 11.  CBC with leukocytosis 12 100. Chest x-ray revealed low lung volume with mild cardiomegaly, small bilateral pleural effusions, mild interstitial coarsening which may represent pulmonary edema.  EKG and telemetry revealed A-fib RVR with ventricular rate up to 200s.  She is hemodynamically stable  at ED, started on diltiazem drip, heart rate improved from 200-1 50s at current time.  She remains normotensive.  Cardiology is consulted for further evaluation.    Past Medical History:  Diagnosis Date   Anxiety    intermittent   ASCVD (arteriosclerotic cardiovascular disease)    singel vessel s/p PCI of RCA   CAD (coronary artery disease), native coronary artery    s/p PCI of RCA   Hyperlipidemia    Hypertension    Positional vertigo    chronic   Statin intolerance    most statins and zetia    Past Surgical History:  Procedure Laterality Date   broken ankle  2000   CARDIAC CATHETERIZATION  01/17/1998   single vessel, s/p PTCA stent mid RCA    CORONARY ANGIOPLASTY     CORONARY STENT PLACEMENT       Home Medications:  Prior to Admission medications   Medication Sig Start Date End Date Taking? Authorizing Provider  amLODipine (NORVASC) 10 MG tablet Take 1 tablet (10 mg total) by mouth daily. 04/06/22   [place], Peter M, MD  aspirin 81 MG tablet Take 81 mg by mouth daily.     [provider]  diazepam (VALIUM) 5 MG tablet Take 0.25 mg by mouth every 6 (six) hours as needed for anxiety.    [provider]  metoprolol tartrate (LOPRESSOR) 50 MG tablet Take 1.5 tablets (75 mg total) by mouth 2 (two) times daily. 04/06/22   [place],  Cardiology Consultation   Patient ID: [name] MRN: [id number]; DOB: [birth year]  Admit date: 07/13/2023 Date of Consult: 07/10/2023  PCP:  Renford Dills, MD   [facility] Providers Cardiologist:  Peter Swaziland, MD   {    Patient Profile:   Suzanne Miller is a 89 y.o. female with a hx of CAD s/p PCI to RCA 1999, HTN, HLD, CKD II,  with  who is being seen 07/14/2023 for the evaluation of A fib at the request of Dr Jeraldine Loots.  History of Present Illness:   Suzanne Miller with above PMH who presented to ER today for fast heart rate. She states her daughter sent her here and she has no idea why. She is upset that she is here in the hospital and wants to go home. She has significant hearing loss. She has some congestion and cough recently, denied any fever, chills, abdominal pain, diarrhea, vomiting.  She further denies any chest pain, dizziness, syncope, heart palpitation.  She states she get short of breath when she is upset, like right now.  She lives alone, her nephew comes to visit her and help her with ADLs.  She denied history of MI or CVA.   Per chart review, she follows Dr. Swaziland, had CAD requiring PCI to mid RCA in 1999.  She has history of statin intolerance, historically tolerated low-dose pravastatin.  She was last seen by Irving Burton manage NP 04/07/2023, reports doing well from cardiac standpoint, no symptoms concerning for angina, blood pressure was stable.  She was maintained on aspirin, metoprolol, amlodipine, and pravastatin for medical therapy.  Diagnostic workup today revealed severe hypokalemia 2.9, bicarb 18, glucose 172, BUN 93, creatinine 3.74, anion gap 22, magnesium 2.6, GFR 11.  CBC with leukocytosis 12 100. Chest x-ray revealed low lung volume with mild cardiomegaly, small bilateral pleural effusions, mild interstitial coarsening which may represent pulmonary edema.  EKG and telemetry revealed A-fib RVR with ventricular rate up to 200s.  She is hemodynamically stable  at ED, started on diltiazem drip, heart rate improved from 200-1 50s at current time.  She remains normotensive.  Cardiology is consulted for further evaluation.    Past Medical History:  Diagnosis Date   Anxiety    intermittent   ASCVD (arteriosclerotic cardiovascular disease)    singel vessel s/p PCI of RCA   CAD (coronary artery disease), native coronary artery    s/p PCI of RCA   Hyperlipidemia    Hypertension    Positional vertigo    chronic   Statin intolerance    most statins and zetia    Past Surgical History:  Procedure Laterality Date   broken ankle  2000   CARDIAC CATHETERIZATION  01/17/1998   single vessel, s/p PTCA stent mid RCA    CORONARY ANGIOPLASTY     CORONARY STENT PLACEMENT       Home Medications:  Prior to Admission medications   Medication Sig Start Date End Date Taking? Authorizing Provider  amLODipine (NORVASC) 10 MG tablet Take 1 tablet (10 mg total) by mouth daily. 04/06/22   [place], Peter M, MD  aspirin 81 MG tablet Take 81 mg by mouth daily.     [provider]  diazepam (VALIUM) 5 MG tablet Take 0.25 mg by mouth every 6 (six) hours as needed for anxiety.    [provider]  metoprolol tartrate (LOPRESSOR) 50 MG tablet Take 1.5 tablets (75 mg total) by mouth 2 (two) times daily. 04/06/22   [place],  Cardiology Consultation   Patient ID: [name] MRN: [id number]; DOB: [birth year]  Admit date: 07/16/2023 Date of Consult: 07/05/2023  PCP:  Renford Dills, MD   [facility] Providers Cardiologist:  Peter Swaziland, MD   {    Patient Profile:   Suzanne Miller is a 89 y.o. female with a hx of CAD s/p PCI to RCA 1999, HTN, HLD, CKD II,  with  who is being seen 07/24/2023 for the evaluation of A fib at the request of Dr Jeraldine Loots.  History of Present Illness:   Suzanne Miller with above PMH who presented to ER today for fast heart rate. She states her daughter sent her here and she has no idea why. She is upset that she is here in the hospital and wants to go home. She has significant hearing loss. She has some congestion and cough recently, denied any fever, chills, abdominal pain, diarrhea, vomiting.  She further denies any chest pain, dizziness, syncope, heart palpitation.  She states she get short of breath when she is upset, like right now.  She lives alone, her nephew comes to visit her and help her with ADLs.  She denied history of MI or CVA.   Per chart review, she follows Dr. Swaziland, had CAD requiring PCI to mid RCA in 1999.  She has history of statin intolerance, historically tolerated low-dose pravastatin.  She was last seen by Irving Burton manage NP 04/07/2023, reports doing well from cardiac standpoint, no symptoms concerning for angina, blood pressure was stable.  She was maintained on aspirin, metoprolol, amlodipine, and pravastatin for medical therapy.  Diagnostic workup today revealed severe hypokalemia 2.9, bicarb 18, glucose 172, BUN 93, creatinine 3.74, anion gap 22, magnesium 2.6, GFR 11.  CBC with leukocytosis 12 100. Chest x-ray revealed low lung volume with mild cardiomegaly, small bilateral pleural effusions, mild interstitial coarsening which may represent pulmonary edema.  EKG and telemetry revealed A-fib RVR with ventricular rate up to 200s.  She is hemodynamically stable  at ED, started on diltiazem drip, heart rate improved from 200-1 50s at current time.  She remains normotensive.  Cardiology is consulted for further evaluation.    Past Medical History:  Diagnosis Date   Anxiety    intermittent   ASCVD (arteriosclerotic cardiovascular disease)    singel vessel s/p PCI of RCA   CAD (coronary artery disease), native coronary artery    s/p PCI of RCA   Hyperlipidemia    Hypertension    Positional vertigo    chronic   Statin intolerance    most statins and zetia    Past Surgical History:  Procedure Laterality Date   broken ankle  2000   CARDIAC CATHETERIZATION  01/17/1998   single vessel, s/p PTCA stent mid RCA    CORONARY ANGIOPLASTY     CORONARY STENT PLACEMENT       Home Medications:  Prior to Admission medications   Medication Sig Start Date End Date Taking? Authorizing Provider  amLODipine (NORVASC) 10 MG tablet Take 1 tablet (10 mg total) by mouth daily. 04/06/22   [place], Peter M, MD  aspirin 81 MG tablet Take 81 mg by mouth daily.     [provider]  diazepam (VALIUM) 5 MG tablet Take 0.25 mg by mouth every 6 (six) hours as needed for anxiety.    [provider]  metoprolol tartrate (LOPRESSOR) 50 MG tablet Take 1.5 tablets (75 mg total) by mouth 2 (two) times daily. 04/06/22   [place],  Cardiology Consultation   Patient ID: [name] MRN: [id number]; DOB: [birth year]  Admit date: 07/13/2023 Date of Consult: 07/10/2023  PCP:  Renford Dills, MD   [facility] Providers Cardiologist:  Peter Swaziland, MD   {    Patient Profile:   Suzanne Miller is a 89 y.o. female with a hx of CAD s/p PCI to RCA 1999, HTN, HLD, CKD II,  with  who is being seen 07/14/2023 for the evaluation of A fib at the request of Dr Jeraldine Loots.  History of Present Illness:   Suzanne Miller with above PMH who presented to ER today for fast heart rate. She states her daughter sent her here and she has no idea why. She is upset that she is here in the hospital and wants to go home. She has significant hearing loss. She has some congestion and cough recently, denied any fever, chills, abdominal pain, diarrhea, vomiting.  She further denies any chest pain, dizziness, syncope, heart palpitation.  She states she get short of breath when she is upset, like right now.  She lives alone, her nephew comes to visit her and help her with ADLs.  She denied history of MI or CVA.   Per chart review, she follows Dr. Swaziland, had CAD requiring PCI to mid RCA in 1999.  She has history of statin intolerance, historically tolerated low-dose pravastatin.  She was last seen by Irving Burton manage NP 04/07/2023, reports doing well from cardiac standpoint, no symptoms concerning for angina, blood pressure was stable.  She was maintained on aspirin, metoprolol, amlodipine, and pravastatin for medical therapy.  Diagnostic workup today revealed severe hypokalemia 2.9, bicarb 18, glucose 172, BUN 93, creatinine 3.74, anion gap 22, magnesium 2.6, GFR 11.  CBC with leukocytosis 12 100. Chest x-ray revealed low lung volume with mild cardiomegaly, small bilateral pleural effusions, mild interstitial coarsening which may represent pulmonary edema.  EKG and telemetry revealed A-fib RVR with ventricular rate up to 200s.  She is hemodynamically stable  at ED, started on diltiazem drip, heart rate improved from 200-1 50s at current time.  She remains normotensive.  Cardiology is consulted for further evaluation.    Past Medical History:  Diagnosis Date   Anxiety    intermittent   ASCVD (arteriosclerotic cardiovascular disease)    singel vessel s/p PCI of RCA   CAD (coronary artery disease), native coronary artery    s/p PCI of RCA   Hyperlipidemia    Hypertension    Positional vertigo    chronic   Statin intolerance    most statins and zetia    Past Surgical History:  Procedure Laterality Date   broken ankle  2000   CARDIAC CATHETERIZATION  01/17/1998   single vessel, s/p PTCA stent mid RCA    CORONARY ANGIOPLASTY     CORONARY STENT PLACEMENT       Home Medications:  Prior to Admission medications   Medication Sig Start Date End Date Taking? Authorizing Provider  amLODipine (NORVASC) 10 MG tablet Take 1 tablet (10 mg total) by mouth daily. 04/06/22   [place], Peter M, MD  aspirin 81 MG tablet Take 81 mg by mouth daily.     [provider]  diazepam (VALIUM) 5 MG tablet Take 0.25 mg by mouth every 6 (six) hours as needed for anxiety.    [provider]  metoprolol tartrate (LOPRESSOR) 50 MG tablet Take 1.5 tablets (75 mg total) by mouth 2 (two) times daily. 04/06/22   [place],

## 2023-07-18 NOTE — ED Notes (Signed)
Abnormal   Collection Time: 07/22/2023  9:24 AM  Result Value Ref Range   Sodium 141 135 - 145 mmol/L   Potassium 2.9 (L) 3.5 - 5.1 mmol/L   Chloride 101 98 - 111 mmol/L   CO2 18 (L) 22 - 32 mmol/L   Glucose, Bld 172 (H) 70 - 99 mg/dL    Comment: Glucose reference range applies only to samples taken after fasting for at least 8 hours.   BUN 93 (H) 8 - 23 mg/dL   Creatinine, Ser 1.61 (H) 0.44 - 1.00 mg/dL   Calcium 9.5 8.9 - 09.6 mg/dL   GFR, Estimated 11 (L) >60 mL/min    Comment:  (NOTE) Calculated using the CKD-EPI Creatinine Equation (2021)    Anion gap 22 (H) 5 - 15    Comment: ELECTROLYTES REPEATED TO VERIFY Performed at [facility], [address], [place], [place] [address]   Magnesium     Status: Abnormal   Collection Time: 06/26/2023  9:24 AM  Result Value Ref Range   Magnesium 2.6 (H) 1.7 - 2.4 mg/dL    Comment: Performed at [facility], [address] [place], [place] [address]  CBC     Status: Abnormal   Collection Time: 07/24/2023  9:24 AM  Result Value Ref Range   WBC 12.1 (H) 4.0 - 10.5 K/uL   RBC 4.12 3.87 - 5.11 MIL/uL   Hemoglobin 12.1 12.0 - 15.0 g/dL   HCT 14.7 82.9 - 56.2 %   MCV 91.5 80.0 - 100.0 fL   MCH 29.4 26.0 - 34.0 pg   MCHC 32.1 30.0 - 36.0 g/dL   RDW 13.0 86.5 - 78.4 %   Platelets 172 150 - 400 K/uL   nRBC 0.0 0.0 - 0.2 %    Comment: Performed at [facility], [address] [place], [place] [address]  TSH     Status: None   Collection Time: 07/21/2023  9:24 AM  Result Value Ref Range   TSH 1.107 0.350 - 4.500 uIU/mL    Comment: Performed by a 3rd Generation assay with a functional sensitivity of <=0.01 uIU/mL. Performed at [facility], [address], [place], [place] [address]   Brain natriuretic peptide     Status: Abnormal   Collection Time: 07/02/2023  9:24 AM  Result Value Ref Range   B Natriuretic Peptide 337.8 (H) 0.0 - 100.0 pg/mL    Comment: Performed at [facility], [address] [place], [place] [address]  Resp panel by RT-PCR (RSV, Flu A&B, Covid) Anterior Nasal Swab     Status: Abnormal   Collection Time: 07/22/2023  2:07 PM   Specimen: Anterior Nasal Swab  Result Value Ref Range   SARS Coronavirus 2 by RT PCR POSITIVE (A) NEGATIVE   Influenza A by PCR NEGATIVE NEGATIVE   Influenza B by PCR NEGATIVE NEGATIVE    Comment: (NOTE) The Xpert Xpress SARS-CoV-2/FLU/RSV plus assay is intended as an aid in the diagnosis of influenza from Nasopharyngeal swab specimens and should not  be used as a sole basis for treatment. Nasal washings and aspirates are unacceptable for Xpert Xpress SARS-CoV-2/FLU/RSV testing.  Fact Sheet for Patients: BloggerCourse.com  Fact Sheet for Healthcare Providers: SeriousBroker.it  This test is not yet approved or cleared by the [place] FDA and has been authorized for detection and/or diagnosis of SARS-CoV-2 by FDA under an Emergency Use Authorization (EUA). This EUA will remain in effect (meaning this test  Pulse: (!) 113  (!) 131 80  Resp: (!) 28  19 (!) 23  Temp:  (!)  97 F (36.1 C)    TempSrc:  Oral    SpO2: 97%  100% 98%  Weight:      Height:      PainSc:        Isolation Precautions Airborne and Contact precautions  Medications Medications  diltiazem (CARDIZEM) 1 mg/mL load via infusion 20 mg (20 mg Intravenous Bolus from Bag 07/20/2023 0935)    And  diltiazem (CARDIZEM) 125 mg in dextrose 5% 125 mL (1 mg/mL) infusion (15 mg/hr Intravenous New Bag/Given 07/25/2023 1620)  metoprolol tartrate (LOPRESSOR) tablet 75 mg (has no administration in time range)  sodium chloride flush (NS) 0.9 % injection 3 mL (3 mLs Intravenous Not Given 07/17/2023 1349)  acetaminophen (TYLENOL) tablet 650 mg (has no administration in time range)    Or  acetaminophen (TYLENOL) suppository 650 mg (has no administration in time range)  albuterol (PROVENTIL) (2.5 MG/3ML) 0.083% nebulizer solution 2.5 mg (has no administration in time range)  potassium chloride (KLOR-CON) packet 20 mEq (20 mEq Oral Given 07/04/2023 1509)  guaiFENesin (MUCINEX) 12 hr tablet 600 mg (has no administration in time range)  sodium chloride 0.9 % bolus 1,000 mL (1,000 mLs Intravenous New Bag/Given 07/05/2023 1158)    Mobility non-ambulatory     Focused Assessments Cardiac Assessment Handoff:  Cardiac Rhythm: Supraventricular tachycardia No results found for: "CKTOTAL", "CKMB", "CKMBINDEX", "TROPONINI" No results found for: "DDIMER" Does the Patient currently have chest pain? No    R Recommendations: See Admitting Provider Note  Report given to:   Additional Notes:  Abnormal   Collection Time: 07/22/2023  9:24 AM  Result Value Ref Range   Sodium 141 135 - 145 mmol/L   Potassium 2.9 (L) 3.5 - 5.1 mmol/L   Chloride 101 98 - 111 mmol/L   CO2 18 (L) 22 - 32 mmol/L   Glucose, Bld 172 (H) 70 - 99 mg/dL    Comment: Glucose reference range applies only to samples taken after fasting for at least 8 hours.   BUN 93 (H) 8 - 23 mg/dL   Creatinine, Ser 1.61 (H) 0.44 - 1.00 mg/dL   Calcium 9.5 8.9 - 09.6 mg/dL   GFR, Estimated 11 (L) >60 mL/min    Comment:  (NOTE) Calculated using the CKD-EPI Creatinine Equation (2021)    Anion gap 22 (H) 5 - 15    Comment: ELECTROLYTES REPEATED TO VERIFY Performed at [facility], [address], [place], [place] [address]   Magnesium     Status: Abnormal   Collection Time: 06/26/2023  9:24 AM  Result Value Ref Range   Magnesium 2.6 (H) 1.7 - 2.4 mg/dL    Comment: Performed at [facility], [address] [place], [place] [address]  CBC     Status: Abnormal   Collection Time: 07/24/2023  9:24 AM  Result Value Ref Range   WBC 12.1 (H) 4.0 - 10.5 K/uL   RBC 4.12 3.87 - 5.11 MIL/uL   Hemoglobin 12.1 12.0 - 15.0 g/dL   HCT 14.7 82.9 - 56.2 %   MCV 91.5 80.0 - 100.0 fL   MCH 29.4 26.0 - 34.0 pg   MCHC 32.1 30.0 - 36.0 g/dL   RDW 13.0 86.5 - 78.4 %   Platelets 172 150 - 400 K/uL   nRBC 0.0 0.0 - 0.2 %    Comment: Performed at [facility], [address] [place], [place] [address]  TSH     Status: None   Collection Time: 07/21/2023  9:24 AM  Result Value Ref Range   TSH 1.107 0.350 - 4.500 uIU/mL    Comment: Performed by a 3rd Generation assay with a functional sensitivity of <=0.01 uIU/mL. Performed at [facility], [address], [place], [place] [address]   Brain natriuretic peptide     Status: Abnormal   Collection Time: 07/02/2023  9:24 AM  Result Value Ref Range   B Natriuretic Peptide 337.8 (H) 0.0 - 100.0 pg/mL    Comment: Performed at [facility], [address] [place], [place] [address]  Resp panel by RT-PCR (RSV, Flu A&B, Covid) Anterior Nasal Swab     Status: Abnormal   Collection Time: 07/22/2023  2:07 PM   Specimen: Anterior Nasal Swab  Result Value Ref Range   SARS Coronavirus 2 by RT PCR POSITIVE (A) NEGATIVE   Influenza A by PCR NEGATIVE NEGATIVE   Influenza B by PCR NEGATIVE NEGATIVE    Comment: (NOTE) The Xpert Xpress SARS-CoV-2/FLU/RSV plus assay is intended as an aid in the diagnosis of influenza from Nasopharyngeal swab specimens and should not  be used as a sole basis for treatment. Nasal washings and aspirates are unacceptable for Xpert Xpress SARS-CoV-2/FLU/RSV testing.  Fact Sheet for Patients: BloggerCourse.com  Fact Sheet for Healthcare Providers: SeriousBroker.it  This test is not yet approved or cleared by the [place] FDA and has been authorized for detection and/or diagnosis of SARS-CoV-2 by FDA under an Emergency Use Authorization (EUA). This EUA will remain in effect (meaning this test  Pulse: (!) 113  (!) 131 80  Resp: (!) 28  19 (!) 23  Temp:  (!)  97 F (36.1 C)    TempSrc:  Oral    SpO2: 97%  100% 98%  Weight:      Height:      PainSc:        Isolation Precautions Airborne and Contact precautions  Medications Medications  diltiazem (CARDIZEM) 1 mg/mL load via infusion 20 mg (20 mg Intravenous Bolus from Bag 07/20/2023 0935)    And  diltiazem (CARDIZEM) 125 mg in dextrose 5% 125 mL (1 mg/mL) infusion (15 mg/hr Intravenous New Bag/Given 07/25/2023 1620)  metoprolol tartrate (LOPRESSOR) tablet 75 mg (has no administration in time range)  sodium chloride flush (NS) 0.9 % injection 3 mL (3 mLs Intravenous Not Given 07/17/2023 1349)  acetaminophen (TYLENOL) tablet 650 mg (has no administration in time range)    Or  acetaminophen (TYLENOL) suppository 650 mg (has no administration in time range)  albuterol (PROVENTIL) (2.5 MG/3ML) 0.083% nebulizer solution 2.5 mg (has no administration in time range)  potassium chloride (KLOR-CON) packet 20 mEq (20 mEq Oral Given 07/04/2023 1509)  guaiFENesin (MUCINEX) 12 hr tablet 600 mg (has no administration in time range)  sodium chloride 0.9 % bolus 1,000 mL (1,000 mLs Intravenous New Bag/Given 07/05/2023 1158)    Mobility non-ambulatory     Focused Assessments Cardiac Assessment Handoff:  Cardiac Rhythm: Supraventricular tachycardia No results found for: "CKTOTAL", "CKMB", "CKMBINDEX", "TROPONINI" No results found for: "DDIMER" Does the Patient currently have chest pain? No    R Recommendations: See Admitting Provider Note  Report given to:   Additional Notes:

## 2023-07-18 NOTE — ED Notes (Signed)
Pads placed on pt and plugged into ZOLL due to high HR

## 2023-07-18 NOTE — Progress Notes (Signed)
Unable to perform ECHO due to elevated heart rate (130-170 bpm) Doctor Jeraldine Loots is aware.  Dondra Prader RVT RCS

## 2023-07-18 NOTE — H&P (Addendum)
History and Physical    Patient: Suzanne Miller:[id number] DOB: 01/23/1930 DOA: 06/29/2023 DOS: the patient was seen and examined on 07/24/2023 PCP: Renford Dills, MD  Patient coming from: Home via EMS  Chief Complaint: Elevated heart rate HPI: Suzanne Miller is a 89 y.o. female with medical history significant of hypertension, hyperlipidemia, CAD, positional vertigo, and anxiety who presented due to elevated heart rates.  History is difficult to obtain from the patient due to her being hard of hearing.  Apparently the patient's daughter had sent EMS.  Patient notes that she had some viral illness earlier this month for which she has had a cough, but reports that it have been improving.  Family had reported weakness as well as lack of appetite recently.  Patient states that she had not had any palpitations, chest pain, shortness of breath, nausea, vomiting, or diarrhea symptoms.  However, does report shortness of breath whenever she gets upset which can happen due to her daughter.  EMS found patient to be tachycardic with heart rates into the 180-190s in atrial fibrillation.  Patient denies any prior history of this to her knowledge.  She is normally able to get around with use of a walker and still lives alone with family coming to check-in [place].  In the emergency department patient was noted to be afebrile with pulse elevated up to 215, respirations 14-30, blood pressures maintained, and O2 saturations in itially as low as 86% with O2 saturations currently maintained on room air. Labs significant for WBC 12.1, potassium 2.9, BUN 93, creatinine 3.74, glucose 172, anion gap 22, TSH 1.07, and magnesium 2.6.  Chest x-ray noted low lung volumes with cardiomegaly, small bilateral pleural effusions, and mild interstitial coarsening thought to represent pulmonary edema.  Patient had initially been giving her normal saline IV fluids and was started on Cardizem drip.  Review of Systems: As mentioned in  the history of present illness. All other systems reviewed and are negative. Past Medical History:  Diagnosis Date   Anxiety    intermittent   ASCVD (arteriosclerotic cardiovascular disease)    singel vessel s/p PCI of RCA   CAD (coronary artery disease), native coronary artery    s/p PCI of RCA   Hyperlipidemia    Hypertension    Positional vertigo    chronic   Statin intolerance    most statins and zetia   Past Surgical History:  Procedure Laterality Date   broken ankle  2000   CARDIAC CATHETERIZATION  01/17/1998   single vessel, s/p PTCA stent mid RCA    CORONARY ANGIOPLASTY     CORONARY STENT PLACEMENT     Social History:  reports that she has quit smoking. She has never used smokeless tobacco. She reports that she does not drink alcohol and does not use drugs.  Allergies  Allergen Reactions   Penicillins     Has patient had a PCN reaction causing immediate rash, facial/tongue/throat swelling, SOB or lightheadedness with hypotension:YES Has patient had a PCN reaction causing severe rash involving mucus membranes or skin necrosis: NO Has patient had a PCN reaction that required hospitalization NO Has patient had a PCN reaction occurring within the last 10 years:NO If all of the above answers are "NO", then may proceed with Cephalosporin use.   Sulfa Antibiotics     Rash    Ticlid [Ticlopidine]     Rash     Family History  Problem Relation Age of Onset   Diabetes Mother  Pneumonia Father    Heart disease Sister    Diabetes Sister    Heart disease Brother    Diabetes Brother    Arrhythmia Sister    Heart disease Brother    Diabetes Brother     Prior to Admission medications   Medication Sig Start Date End Date Taking? Authorizing Provider  amLODipine (NORVASC) 10 MG tablet Take 1 tablet (10 mg total) by mouth daily. 04/06/22   [place], Peter M, MD  aspirin 81 MG tablet Take 81 mg by mouth daily.     [provider]  diazepam (VALIUM) 5 MG tablet Take  0.25 mg by mouth every 6 (six) hours as needed for anxiety.    [provider]  metoprolol tartrate (LOPRESSOR) 50 MG tablet Take 1.5 tablets (75 mg total) by mouth 2 (two) times daily. 04/06/22   [place], Peter M, MD  Multiple Vitamins-Minerals (MULTIVITAMIN PO) Take 1 tablet by mouth daily.     [provider]  pravastatin (PRAVACHOL) 20 MG tablet Take 1 tablet (20 mg total) by mouth daily. 04/06/22   [place], Peter M, MD  vitamin B-12 (CYANOCOBALAMIN) 500 MCG tablet Take 500 mcg by mouth daily.    [provider]  Vitamin D, Cholecalciferol, 1000 UNITS TABS Take by mouth.    [provider]    Physical Exam: Vitals:   06/28/2023 1239 06/26/2023 1245 07/11/2023 1300 07/19/2023 1310  BP:    139/71  Pulse: (!) 102 (!) 111 (!) 131 (!) 113  Resp: (!) 23 (!) 22 (!) 26 (!) 28  Temp:      TempSrc:      SpO2: 97% 97% 97% 97%  Weight:      Height:       Constitutional: Elderly female currently no acute distress Eyes: PERRL, lids and conjunctivae normal ENMT: Mucous membranes are moist.  Very hard of hearing. Neck: normal, supple, no masses, no thyromegaly Respiratory: clear to auscultation bilaterally, no wheezing, no crackles. Normal respiratory effort. No accessory muscle use.  Cardiovascular: Irregular irregular and tachycardic.  No significant lower extremity edema appreciated. Abdomen: no tenderness, no masses palpated. Bowel sounds positive.  Musculoskeletal: no clubbing / cyanosis. No joint deformity upper and lower extremities. Good ROM, no contractures. Normal muscle tone.  Skin: no rashes, lesions, ulcers. No induration Neurologic: CN 2-12 grossly intact.  . Strength 5/5 in all 4.  Psychiatric: Normal judgment and insight. Alert and oriented x 3. Normal mood.   Data Reviewed:  EKG revealed atrial fibrillation at 203 bpm.  Reviewed labs, imaging, and pertinent records as documented  Assessment and Plan:  Atrial fibrillation with RVR Patient presented  with heart rates elevated into the 200s in atrial fibrillation prior history of this in the past.  Thyroid studies were within normal limits.  Patient was started on a Cardizem drip. CHA2DS2-VASc score equal to at least 4 based off age, hypertension, and sex.  However patient noted to be high risk for falls/bleeding.  Patient was placed on a Cardizem drip and rate controlled. -Admit to a progressive bed -Goal potassium at least 4 and magnesium at least 2.  Replace electrolytes as needed -Add on BNP -Continue Cardizem drip   -Check echocardiogram once able -Will need to discuss risks and benefits of anticoagulation once able to get a hold of family -Appreciate cardiology consultative services, will follow-up for any further recommendations  Leukocytosis  Acute.  WBC elevated 12.1.  Workup revealed patient to be positive for COVID-19 along with abnormal urinalysis. -  CBC tomorrow morning  COVID-19 infection Acute.  Patient reports having a cough that has been getting better.  She was found to be positive for COVID-19.  Chest x-ray noted low lung volumes with mild interstitial coarsening thought to represent edema.  Patient not a candidate for Paxlovid given kidney function. -COVID-19 order set utilized -Incentive spirometry and flutter valve -Albuterol as needed -Mucinex    Abnormal urinalysis Patient was noted to have large leukocytes, few bacteria, 6-10  nonsquamous epithelial cells, 6-10 squamous epithelial, and greater than 50 WBCs.  Suspect urinalysis could be contaminated. -Check urine culture -Give Rocephin IV  Possible congestive heart failure exacerbation Patient without significant lower extremity swelling or JVD appreciated.  O2 saturations currently maintained on room air.  Chest x-ray had noted cardiomegaly with pleural effusions present possible interstitial edema.  Not likely secondary to atrial fibrillation. -Follow-up echocardiogram once able to be obtained -Add on  BNP -Consider need of Lasix IV  Acute kidney injury imposed on chronic kidney disease stage 3B Admission creatinine noted to be elevated up to 3.74 with BUN 93.  Last available creatinine 1.6 back in 2021.  Patient had been bolused 1 L normal saline IV fluids.  Thought secondary to hypoperfusion in setting of elevated heart rates. -Check urinalysis -Check urine creatinine and urine sodium -Continue to monitor kidney function  Hypokalemia Acute.  Initial potassium 2.9.  Patient reported inability to swallow the potassium chloride pills. -Give 20 mEq of potassium chloride packets twice daily x 3 doses -Recheck potassium levels in a.m.  Metabolic acidosis with elevated anion gap CO2 noted to be 18 with anion gap of 22.  Thought secondary to patient's kidney dysfunction and/or infection -Continue to monitor  Essential hypertension Home medication regimen includes metoprolol 75 mg p.o.twice daily and amlodipine -Continue metoprolol as tolerated -Held amlodipine  Hyperlipidemia -Continue pravastatin  Anxiety -Continue Valium as needed   DVT prophylaxis: Heparin Advance Care Planning:   Code Status: Full Code    Consults: Cardiology  Family Communication: Attempted to call number on file, but no answer.  Severity of Illness: The appropriate patient status for this patient is INPATIENT. Inpatient status is judged to be reasonable and necessary in order to provide the required intensity of service to ensure the patient's safety. The patient's presenting symptoms, physical exam findings, and initial radiographic and laboratory data in the context of their chronic comorbidities is felt to place them at high risk for further clinical deterioration. Furthermore, it is not anticipated that the patient will be medically stable for discharge from the hospital within 2 midnights of admission.   * I certify that at the point of admission it is my clinical judgment that the patient will require  inpatient hospital care spanning beyond 2 midnights from the point of admission due to high intensity of service, high risk for further deterioration and high frequency of surveillance required.*  Author: Clydie Braun, MD August 06, 2023 1:18 PM  For on call review [email or web address].

## 2023-07-18 NOTE — ED Triage Notes (Signed)
PT BIB [facility] from home due to daughter calling for patient as she was acting "weird."  [facility] when getting vitals noticed patient was in a-fib heart rate around 200.  VS BP 130/80, HR 200, Resp 14, SpO2 98% RA, CBG 170

## 2023-07-18 NOTE — ED Notes (Signed)
Pt placed on zoll pads

## 2023-07-18 NOTE — ED Provider Notes (Signed)
unit.         Final Clinical Impression(s) / ED Diagnoses Final diagnoses:  Atrial fibrillation with rapid ventricular response (HCC)  AKI (acute kidney injury) (HCC)   CRITICAL CARE Performed by: Gerhard Munch Total critical care time: 35 minutes Critical care time was exclusive of separately billable procedures and treating other patients. Critical care was necessary to treat or prevent imminent or life-threatening deterioration. Critical care was time spent personally by me on the following activities: development of treatment plan with patient and/or surrogate as well as nursing, discussions with consultants, evaluation of patient's response to treatment, examination of patient, obtaining history from patient or surrogate, ordering and performing treatments and interventions, ordering and review of laboratory studies, ordering and review of radiographic studies, pulse oximetry and re-evaluation of patient's condition.     Gerhard Munch, MD 07/23/2023 [phone]  unit.         Final Clinical Impression(s) / ED Diagnoses Final diagnoses:  Atrial fibrillation with rapid ventricular response (HCC)  AKI (acute kidney injury) (HCC)   CRITICAL CARE Performed by: Gerhard Munch Total critical care time: 35 minutes Critical care time was exclusive of separately billable procedures and treating other patients. Critical care was necessary to treat or prevent imminent or life-threatening deterioration. Critical care was time spent personally by me on the following activities: development of treatment plan with patient and/or surrogate as well as nursing, discussions with consultants, evaluation of patient's response to treatment, examination of patient, obtaining history from patient or surrogate, ordering and performing treatments and interventions, ordering and review of laboratory studies, ordering and review of radiographic studies, pulse oximetry and re-evaluation of patient's condition.     Gerhard Munch, MD 07/23/2023 [phone]  [facility] EMERGENCY DEPARTMENT AT [facility] Provider Note   CSN: [id number] Arrival date & time: 07/15/2023  4098     History  No chief complaint on file.   Suzanne Miller is a 89 y.o. female.  HPI Presents from home via EMS.  History is obtained by those individuals and the patient.  Patient's poor hearing is somewhat prohibitive. She presents with concern for weakness, lack of appetite according to family members who are with her prior to 911 notification. Patient denies pain, dyspnea, acknowledges mild weakness. No history of cardiac disease.  EMS reports the patient had tachycardia, heart rate 180s, 190s, with evidence for A-fib in transport.    Home Medications Prior to Admission medications   Medication Sig Start Date End Date Taking? Authorizing Provider  amLODipine (NORVASC) 10 MG tablet Take 1 tablet (10 mg total) by mouth daily. 04/06/22   [place], Peter M, MD  aspirin 81 MG tablet Take 81 mg by mouth daily.     [provider]  diazepam (VALIUM) 5 MG tablet Take 0.25 mg by mouth every 6 (six) hours as needed for anxiety.    [provider]  metoprolol tartrate (LOPRESSOR) 50 MG tablet Take 1.5 tablets (75 mg total) by mouth 2 (two) times daily. 04/06/22   [place], Peter M, MD  Multiple Vitamins-Minerals (MULTIVITAMIN PO) Take 1 tablet by mouth daily.     [provider]  pravastatin (PRAVACHOL) 20 MG tablet Take 1 tablet (20 mg total) by mouth daily. 04/06/22   [place], Peter M, MD  vitamin B-12 (CYANOCOBALAMIN) 500 MCG tablet Take 500 mcg by mouth daily.    [provider]  Vitamin D, Cholecalciferol, 1000 UNITS TABS Take by mouth.    [provider]      Allergies    Penicillins, Sulfa antibiotics, and Ticlid [ticlopidine]    Review of Systems   Review of Systems  All other systems reviewed and are negative.   Physical Exam Updated Vital Signs BP (!) 147/86   Pulse 79   Temp 98.3 F (36.8 C) (Oral)   Resp  (!) 23   Ht 4\' 11"  (1.499 m)   Wt 54.4 kg   SpO2 96%   BMI 24.24 kg/m  Physical Exam Vitals and nursing note reviewed.  Constitutional:      General: She is not in acute distress.    Appearance: She is well-developed.  HENT:     Head: Normocephalic and atraumatic.  Eyes:     Conjunctiva/sclera: Conjunctivae normal.  Cardiovascular:     Rate and Rhythm: Tachycardia present. Rhythm irregular.  Pulmonary:     Effort: Pulmonary effort is normal. No respiratory distress.     Breath sounds: Normal breath sounds. No stridor.  Abdominal:     General: There is no distension.  Skin:    General: Skin is warm and dry.  Neurological:     Mental Status: She is alert and oriented to person, place, and time.     Cranial Nerves: Cranial nerve deficit present.     Comments: Very poor hearing otherwise cranial nerves unremarkable. There is age-appropriate atrophy, but the patient moves all extremity spontaneously and to command.  Psychiatric:        Mood and Affect: Mood normal.     ED Results / Procedures / Treatments   Labs (all labs ordered are listed, but only abnormal results are displayed) Labs Reviewed  BASIC METABOLIC PANEL - Abnormal; Notable for the following components:

## 2023-07-18 NOTE — ED Notes (Signed)
Echo to come back later; with pt elevated heart rate unable to.  MD Jeraldine Loots aware.

## 2023-07-19 ENCOUNTER — Inpatient Hospital Stay (HOSPITAL_COMMUNITY): Payer: Medicare Other

## 2023-07-19 DIAGNOSIS — H9190 Unspecified hearing loss, unspecified ear: Secondary | ICD-10-CM

## 2023-07-19 DIAGNOSIS — I4891 Unspecified atrial fibrillation: Secondary | ICD-10-CM | POA: Diagnosis not present

## 2023-07-19 DIAGNOSIS — N189 Chronic kidney disease, unspecified: Secondary | ICD-10-CM | POA: Insufficient documentation

## 2023-07-19 DIAGNOSIS — D631 Anemia in chronic kidney disease: Secondary | ICD-10-CM | POA: Insufficient documentation

## 2023-07-19 DIAGNOSIS — N3000 Acute cystitis without hematuria: Secondary | ICD-10-CM | POA: Diagnosis not present

## 2023-07-19 DIAGNOSIS — N184 Chronic kidney disease, stage 4 (severe): Secondary | ICD-10-CM

## 2023-07-19 DIAGNOSIS — N179 Acute kidney failure, unspecified: Secondary | ICD-10-CM | POA: Diagnosis not present

## 2023-07-19 DIAGNOSIS — E7801 Familial hypercholesterolemia: Secondary | ICD-10-CM

## 2023-07-19 DIAGNOSIS — I2510 Atherosclerotic heart disease of native coronary artery without angina pectoris: Secondary | ICD-10-CM

## 2023-07-19 DIAGNOSIS — I5032 Chronic diastolic (congestive) heart failure: Secondary | ICD-10-CM | POA: Diagnosis not present

## 2023-07-19 LAB — BASIC METABOLIC PANEL
Anion gap: 10 (ref 5–15)
BUN: 77 mg/dL — ABNORMAL HIGH (ref 8–23)
CO2: 20 mmol/L — ABNORMAL LOW (ref 22–32)
Calcium: 8.5 mg/dL — ABNORMAL LOW (ref 8.9–10.3)
Chloride: 108 mmol/L (ref 98–111)
Creatinine, Ser: 3.29 mg/dL — ABNORMAL HIGH (ref 0.44–1.00)
GFR, Estimated: 13 mL/min — ABNORMAL LOW (ref >60)
Glucose, Bld: 134 mg/dL — ABNORMAL HIGH (ref 70–99)
Potassium: 3.6 mmol/L (ref 3.5–5.1)
Sodium: 138 mmol/L (ref 135–145)

## 2023-07-19 LAB — CBC
HCT: 31.7 % — ABNORMAL LOW (ref 36.0–46.0)
Hemoglobin: 10.5 g/dL — ABNORMAL LOW (ref 12.0–15.0)
MCH: 30.3 pg (ref 26.0–34.0)
MCHC: 33.1 g/dL (ref 30.0–36.0)
MCV: 91.6 fL (ref 80.0–100.0)
Platelets: 145 10*3/uL — ABNORMAL LOW (ref 150–400)
RBC: 3.46 MIL/uL — ABNORMAL LOW (ref 3.87–5.11)
RDW: 12.2 % (ref 11.5–15.5)
WBC: 27.6 10*3/uL — ABNORMAL HIGH (ref 4.0–10.5)
nRBC: 0 % (ref 0.0–0.2)

## 2023-07-19 LAB — ECHOCARDIOGRAM COMPLETE
Height: 59 in
S' Lateral: 2 cm
Weight: 1920 oz

## 2023-07-19 MED ORDER — SODIUM CHLORIDE 0.9 % IV SOLN: INTRAVENOUS | Status: DC

## 2023-07-19 MED ORDER — LORAZEPAM 2 MG/ML IJ SOLN: 0.5000 mg | INTRAMUSCULAR | Status: AC

## 2023-07-19 MED ORDER — ENOXAPARIN SODIUM 30 MG/0.3ML IJ SOSY
30.0000 mg | PREFILLED_SYRINGE | INTRAMUSCULAR | Status: DC
  Administered 2023-07-23: 30 mg via SUBCUTANEOUS
  Filled 2023-07-19 (×4): qty 0.3

## 2023-07-19 NOTE — Progress Notes (Signed)
Informed by night shift nurse to give medications crushed. Per patient, she does not take medications crushed. Pills brought back and place in cup, patient informed of what she is taking. Patient reports "you don't have to serve me my damn pills. I know what I'm suppose to take. I'm not taking that! I'll get back on my regimen in the morning" Morning medications still sitting in the cup on the table.

## 2023-07-19 NOTE — Hospital Course (Addendum)
Suzanne Miller is a 89 y.o. female with a history of CKD stage III, CAD status post PCI, hypertension, hyperlipidemia, anxiety.  Patient presented secondary to elevated heart rate was found to have evidence of atrial fibrillation with RVR and rates to the 200s.  Patient was started on Cardizem drip with improvement of heart rates.  Cardiologist consulted for management.  During admission, patient was found to be positive for COVID-19.  No treatment started.  Patient was also found to have an AKI likely related to hypoperfusion in setting of atrial fibrillation with RVR and dehydration. Patient's RVR was difficult to control secondary to medication non-adherence. Patient developed worsening AKI which improved initially with IV fluids; renal ultrasound was significant for no hydronephrosis. Patient then developed worsening respiratory failure with evidence of continued renal failure and pleural effusions noted on imaging. Patient was placed on BiPAP with persistent encephalopathy requiring consideration for ICU admission and intubation. PCCM was consulted and, after discussion with next of kin, decision was made to transition to comfort measures rather than transfer to ICU for mechanical ventilator support. Patient started on a morphine drip for comfort and removed from BiPAP.

## 2023-07-19 NOTE — Assessment & Plan Note (Signed)
-  Continue Rocephin, day 2 of 5

## 2023-07-19 NOTE — Progress Notes (Signed)
Patient asked if she need assistance to bedside commode to try to urinate - order for urine culture to be obtained. Patient stated " leave me alone! Let me sleep!"

## 2023-07-19 NOTE — Plan of Care (Signed)

## 2023-07-19 NOTE — Assessment & Plan Note (Signed)
Unclear what her opacities on CXR are.  She is not edematous, I agree with Cardiology not clearly fluid overloaded.  If anything, seems dry. - IV fluids as above, for AKI - Daily BMP

## 2023-07-19 NOTE — Plan of Care (Signed)
  Problem: Respiratory: Goal: Will maintain a patent airway Outcome: Progressing   Problem: Clinical Measurements: Goal: Respiratory complications will improve Outcome: Progressing   Problem: Education: Goal: Knowledge of risk factors and measures for prevention of condition will improve Outcome: Not Progressing   Problem: Education: Goal: Knowledge of General Education information will improve Description: Including pain rating scale, medication(s)/side effects and non-pharmacologic comfort measures Outcome: Not Progressing   Problem: Health Behavior/Discharge Planning: Goal: Ability to manage health-related needs will improve Outcome: Not Progressing   Problem: Nutrition: Goal: Adequate nutrition will be maintained Outcome: Not Progressing   Problem: Coping: Goal: Level of anxiety will decrease Outcome: Not Progressing   Problem: Pain Managment: Goal: General experience of comfort will improve Outcome: Not Progressing

## 2023-07-19 NOTE — Progress Notes (Addendum)
Patient Name: Suzanne Miller Date of Encounter: 07/19/2023 [facility] Cardiologist: Peter [place], MD   Interval Summary  .    Sitting up in bed, confused. Wants to sleep, refusing PO meds.   Vital Signs .    Vitals:   07/25/2023 2155 07/19/23 0027 07/19/23 0553 07/19/23 0745  BP:   (!) 131/55 121/73  Pulse: (!) 102   80  Resp:    20  Temp: 98.9 F (37.2 C) 97.8 F (36.6 C) 98 F (36.7 C) 97.6 F (36.4 C)  TempSrc: Oral Oral Oral Oral  SpO2: 92%   94%  Weight:      Height:        Intake/Output Summary (Last 24 hours) at 07/19/2023 1146 Last data filed at 07/19/2023 0000 Gross per 24 hour  Intake 298.23 ml  Output 1 ml  Net 297.23 ml      07/06/2023    9:14 AM 04/07/2023    2:58 PM 04/06/2022    3:51 PM  Last 3 Weights  Weight (lbs) 120 lb 133 lb 142 lb  Weight (kg) 54.432 kg 60.328 kg 64.411 kg      Telemetry/ECG    Atrial fibrillation - Personally Reviewed  Physical Exam .   GEN: No acute distress. Confused, agitated when woken   Neck: No JVD Cardiac: Irreg Irreg, no murmurs, rubs, or gallops.  Respiratory: Clear to auscultation bilaterally. GI: Soft, nontender, non-distended  MS: No edema  Assessment & Plan .     Atrial fibrillation with rapid ventricular response, new diagnosis  -- Presented with elevated heart rate, she completely asymptomatic, she did not understand why she needs to be in the hospital, her daughter sent her here because she sounded weird on the phone -- found to have severe hypokalemia 2.9, AKI with creatinine 3.74 and GFR 11, anion gap metabolic acidosis, and leukocytosis 12100 -- suspect atrial fibrillation in the setting of acute illness with COVID -- she has been refusing PO medications, remains on IV Dilt with reasonably controlled HRs -- CHA2DS2-VASc Score = 4 , ideally recommend anticoagulation, but given her advanced age, decreased mobility/increased fall risk would not plan for anticoagulation as risk outweighs benefit   -- echo pending   Per Primary AKI on CKD II Severe hypokalemia  Leukocytosis  HTN HLD COVID   For questions or updates, please contact [facility] Please consult [email or web address] for contact info under   Signed, Laverda Page, NP   Patient seen, examined. Available data reviewed. Agree with findings, assessment, and plan as outlined by Laverda Page, NP.  The patient is not in any acute distress.  She is uncooperative with my evaluation today.  She refuses to let me examine her.  She is breathing comfortably, has no leg edema, and is not tachypneic or tachycardic.  Telemetry shows atrial fibrillation with controlled ventricular rate.  She has a marked leukocytosis now with white blood cell count increased from yesterday's lab value of 12.1 up to 27.6 today.  I would recommend continuing her on IV diltiazem for rate control as she is refusing oral medications.  Her acute kidney injury appears to be improving with creatinine trending down from 3.74-3.29 today.  I agree that she will likely NOT be a candidate for oral anticoagulation.  Otherwise care as per the primary team.  Tonny Bollman, M.D. 07/19/2023 12:36 PM

## 2023-07-19 NOTE — Assessment & Plan Note (Signed)
No recent baseline Cr, last in 2022 was 1.7, CKD IV.    Here with Cr up to 3.74 on admission, in setting of COVID, poor PO intake and Afib.  FeNA very low, dehydration vs cardiogenic low perfusion state.  UA shows moderate protein, no red cells, given no med insult, I suspect WBCs are from cystitis not AIN.  Making urine, don't think renal US useful at this juncture. - IV fluids - Daily BMP - Control HR - Strict urine output measurement

## 2023-07-19 NOTE — Plan of Care (Signed)
Problem: Pain Managment: Goal: General experience of comfort will improve Outcome: Adequate for Discharge

## 2023-07-19 NOTE — Assessment & Plan Note (Signed)
Cr slightly down to 10.5, no clnical bleeding.

## 2023-07-19 NOTE — Progress Notes (Signed)
Dinner tray brought into patient's room by primary nurse. Patient informed that dinner has arrived- fish cut up in small pieces, ice tea opened, and lid taken off soup. Patient told RN to leave her alone and went back to sleep.

## 2023-07-19 NOTE — Assessment & Plan Note (Signed)
-   Continue home Valium PRN

## 2023-07-19 NOTE — Progress Notes (Signed)
Progress Note   Patient: Suzanne Miller ZOX:[id number] DOB: June 01, 1930 DOA: July 27, 2023     1 DOS: the patient was seen and examined on 07/19/2023 at 11:30AM      Brief hospital course: [name] is a 89 y.o. F with CKD IV at least (last baseline 1.7 in 2022), CAD s/p remote PCI, HTN, HLD, deaf, and anxiety who presented due to weakness found to have Afib RVR rates 200 and renal failure Cr up to 3.7.    Recent URI reported, COVID PCR+.     Assessment and Plan: * Acute kidney injury superimposed on chronic kidney disease stage IV (HCC) No recent baseline Cr, last in 2022 was 1.7, CKD IV.    Here with Cr up to 3.74 on admission, in setting of COVID, poor PO intake and Afib.  FeNA very low, dehydration vs cardiogenic low perfusion state.  UA shows moderate protein, no red cells, given no med insult, I suspect WBCs are from cystitis not AIN.  Making urine, don't think renal US useful at this juncture. - IV fluids - Daily BMP - Control HR - Strict urine output measurement    Atrial fibrillation with rapid ventricular response (HCC) HR better controlled - Consult Cardiology - Agree with Cardiology, not clear how patient would be DOAC candidate - Continue diltizem drip - Continue metoprolol  Chronic heart failure with preserved ejection fraction (HFpEF) (HCC) Unclear what her opacities on CXR are.  She is not edematous, I agree with Cardiology not clearly fluid overloaded.  If anything, seems dry. - IV fluids as above, for AKI - Daily BMP  Acute cystitis - Continue Rocephin, day 2 of 5  COVID-19 virus infection Unclear time of onset.  Reportedly had a cold in the last few weeks, never tested.  Here PCR positive for COVID.  I agree with admitting, in that time frame, Paxlovid, remdesivir, steroids likely more harm than benefit.  Anemia due to chronic kidney disease Cr slightly down to 10.5, no clnical bleeding.  Deaf    Hypokalemia Supplemented  Essential  hypertension BP normal off amlodipine - Continue diltiazem   CAD (coronary artery disease), native coronary artery - Continue pravastatin and aspirin if willing to take PO  Hyperlipidemia - Continue pravastatin   Anxiety - Continue home Valium PRN          Subjective: Patient is completely deaf, only knows that on their monitor, cannot hear me shouting, does not look up or make eye contact.  No nursing concerns reported.  No fever.     Physical Exam: BP 121/73 (BP Location: Left Arm)   Pulse 80   Temp 97.6 F (36.4 C) (Oral)   Resp 20   Ht 4\' 11"  (1.499 m)   Wt 54.4 kg   SpO2 94%   BMI 24.24 kg/m   Elderly adult female, sitting up in bed, completely deaf, does not acknowledge my presence except a small amount of weight Heart rate normal, rhythm irregular, soft systolic murmur, JVP normal, no lower extremity edema Respiratory effort appears normal, she refuses exam, swats my arm away No grimace or apparent pain to palpation of the abdomen No peripheral edema Speech is fluent, with little she says, which is mostly to tell me not to touch her.  Seems to have symmetric upper extremity strength.    Data Reviewed: Basic metabolic panel shows creatinine down to 3.2, potassium up to normal CBC shows white count of 27,000, hemoglobin down to 10 COVID PCR Urinalysis shows white blood  cells, mild.  Protein Fractional secretion of sodium extremely low Chest x-ray with possible edema    Family Communication: Called to family member listed in the computer, no answer, unable to leave voicemail    Disposition: Status is: Inpatient Patient is a 89-year-old female admitted from home with renal failure  Likely this was precipitated by rapid A-fib and COVID  She will need ongoing treatment of her renal failure, likely a few more days IV fluids, then hopefully can be discharged to home.          Author: Alberteen Sam, MD 07/19/2023 1:33 PM  For on  call review [email or web address].

## 2023-07-19 NOTE — Assessment & Plan Note (Signed)
Continue pravastatin

## 2023-07-19 NOTE — TOC Initial Note (Addendum)
Transition of Care [facility]) - Initial/Assessment Note    Patient Details  Name: Suzanne Miller MRN: [id number] Date of Birth: August 03, 1930  Transition of Care [facility]) CM/SW Contact:    Leone Haven, RN Phone Number: 07/19/2023, 4:02 PM  Clinical Narrative:                 From home alone, per daughter her nephew stays close by and helps her a lot but he does not live there, has PCP and insurance on file,  daughter states  she has no HH services in place at this time or DME at home.  States family member will transport them home at [employer] and family is support system,   Pta self ambulatory but sits in chair a lot.  Daughter states patient has mentioned to her that she would like to go to [facility].  NCM informed her we will have to get pt/ot eval to see her.  Await pt eval.    Expected Discharge Plan: Skilled Nursing Facility Barriers to Discharge: Continued Medical Work up   Patient Goals and CMS Choice     Choice offered to / list presented to : NA      Expected Discharge Plan and Services In-house Referral: NA   Post Acute Care Choice: NA                     DME Agency: NA       HH Arranged: NA          Prior Living Arrangements/Services   Lives with:: Self Patient language and need for interpreter reviewed:: Yes Do you feel safe going back to the place where you live?: Yes      Need for Family Participation in Patient Care: Yes (Comment) Care giver support system in place?: Yes (comment)   Criminal Activity/Legal Involvement Pertinent to Current Situation/Hospitalization: No - Comment as needed  Activities of Daily Living Home Assistive Devices/Equipment: Cane (specify quad or straight), Walker (specify type), Wheelchair, Dentures (specify type), Eyeglasses, Grab bars in shower, Reacher, Scales, Shower chair with back, Long-handled sponge ADL Screening (condition at time of admission) Is the patient deaf or have difficulty hearing?: Yes Does the  patient have difficulty seeing, even when wearing glasses/contacts?: No Does the patient have difficulty concentrating, remembering, or making decisions?: No  Permission Sought/Granted                  Emotional Assessment       Orientation: : Oriented to Place, Oriented to Self   Psych Involvement: No (comment)  Admission diagnosis:  Atrial fibrillation with rapid ventricular response (HCC) [I48.91] AKI (acute kidney injury) (HCC) [N17.9] Atrial fibrillation with RVR (HCC) [I48.91] Patient Active Problem List   Diagnosis Date Noted   Deaf 07/19/2023   Anemia due to chronic kidney disease 07/19/2023   Atrial fibrillation with rapid ventricular response (HCC) 07/22/2023   Acute cystitis 07/10/2023   COVID-19 virus infection 07/19/2023   Acute kidney injury superimposed on chronic kidney disease stage IV (HCC) 07/08/2023   Chronic heart failure with preserved ejection fraction (HFpEF) (HCC) 07/19/2023   Hypokalemia 07/16/2023   Essential hypertension 03/24/2018   Hyperlipidemia    CAD (coronary artery disease), native coronary artery    Anxiety 12/17/2013   PCP:  Renford Dills, MD Pharmacy:   [phone] - [place], [place] - [address] AT [facility] [address] [place] [address] Phone: [phone]  Fax: [phone]  [facility]) - [place], [place] - [address] [facility] [address] [place] [address] [place] [address] Phone: [phone] Fax: [phone]     Social Determinants of Health (SDOH) Social History: SDOH Screenings   Food Insecurity: No Food Insecurity (07/30/2023)  Housing: Low Risk  (2023-07-30)  Transportation Needs: No Transportation Needs (07/30/2023)  Utilities: Not At Risk (Jul 30, 2023)  Tobacco Use: Medium Risk (July 30, 2023)   SDOH Interventions:     Readmission Risk Interventions     No data to display

## 2023-07-19 NOTE — Assessment & Plan Note (Signed)
Supplemented 

## 2023-07-19 NOTE — Progress Notes (Signed)
Primary nurse heard patient starting to yell and bang on bed rail. Upon entering room, patient stated she has been calling for an hour to get help. Primary nurse asked patient if she needed to urinate. Patient stated yes. Assisted to [facility]- 200 cc voided. HR up to 120-130s. HR back down in the 110s upon returning back to bed. Dyspnea noted with exertion. Urine sent down to lab at this time.

## 2023-07-19 NOTE — Progress Notes (Signed)
Echocardiogram 2D Echocardiogram has been performed.  Warren Lacy Kyrin Gratz RDCS 07/19/2023, 3:47 PM

## 2023-07-19 NOTE — Assessment & Plan Note (Addendum)
HR better controlled - Consult Cardiology - Agree with Cardiology, not clear how patient would be DOAC candidate - Continue diltizem drip - Continue metoprolol

## 2023-07-19 NOTE — Assessment & Plan Note (Signed)
-   Continue pravastatin and aspirin if willing to take PO

## 2023-07-19 NOTE — Assessment & Plan Note (Signed)
BP normal off amlodipine - Continue diltiazem

## 2023-07-19 NOTE — Progress Notes (Signed)
Patient sitting in bed yelling " help! Help!". Informed by nightshift nurse that patient has been doing that all night

## 2023-07-19 NOTE — Assessment & Plan Note (Signed)
Unclear time of onset.  Reportedly had a cold in the last few weeks, never tested.  Here PCR positive for COVID.  I agree with admitting, in that time frame, Paxlovid, remdesivir, steroids likely more harm than benefit.

## 2023-07-20 DIAGNOSIS — N1832 Chronic kidney disease, stage 3b: Secondary | ICD-10-CM

## 2023-07-20 DIAGNOSIS — I4891 Unspecified atrial fibrillation: Secondary | ICD-10-CM | POA: Diagnosis not present

## 2023-07-20 DIAGNOSIS — U071 COVID-19: Secondary | ICD-10-CM | POA: Diagnosis not present

## 2023-07-20 DIAGNOSIS — N189 Chronic kidney disease, unspecified: Secondary | ICD-10-CM | POA: Diagnosis not present

## 2023-07-20 DIAGNOSIS — N179 Acute kidney failure, unspecified: Secondary | ICD-10-CM | POA: Diagnosis not present

## 2023-07-20 LAB — BASIC METABOLIC PANEL
Anion gap: 14 (ref 5–15)
BUN: 66 mg/dL — ABNORMAL HIGH (ref 8–23)
CO2: 16 mmol/L — ABNORMAL LOW (ref 22–32)
Calcium: 8.5 mg/dL — ABNORMAL LOW (ref 8.9–10.3)
Chloride: 111 mmol/L (ref 98–111)
Creatinine, Ser: 3.1 mg/dL — ABNORMAL HIGH (ref 0.44–1.00)
GFR, Estimated: 14 mL/min — ABNORMAL LOW (ref >60)
Glucose, Bld: 132 mg/dL — ABNORMAL HIGH (ref 70–99)
Potassium: 2.9 mmol/L — ABNORMAL LOW (ref 3.5–5.1)
Sodium: 141 mmol/L (ref 135–145)

## 2023-07-20 LAB — CBC
HCT: 30.6 % — ABNORMAL LOW (ref 36.0–46.0)
Hemoglobin: 10 g/dL — ABNORMAL LOW (ref 12.0–15.0)
MCH: 30.5 pg (ref 26.0–34.0)
MCHC: 32.7 g/dL (ref 30.0–36.0)
MCV: 93.3 fL (ref 80.0–100.0)
Platelets: 148 10*3/uL — ABNORMAL LOW (ref 150–400)
RBC: 3.28 MIL/uL — ABNORMAL LOW (ref 3.87–5.11)
RDW: 12.7 % (ref 11.5–15.5)
WBC: 13.6 10*3/uL — ABNORMAL HIGH (ref 4.0–10.5)
nRBC: 0 % (ref 0.0–0.2)

## 2023-07-20 LAB — URINE CULTURE: Culture: NO GROWTH

## 2023-07-20 LAB — MAGNESIUM: Magnesium: 2.2 mg/dL (ref 1.7–2.4)

## 2023-07-20 MED ORDER — POTASSIUM CHLORIDE CRYS ER 20 MEQ PO TBCR
40.0000 meq | EXTENDED_RELEASE_TABLET | ORAL | Status: DC
  Filled 2023-07-20: qty 2

## 2023-07-20 MED ORDER — POTASSIUM CHLORIDE 20 MEQ PO PACK
40.0000 meq | PACK | ORAL | Status: AC
  Administered 2023-07-20: 40 meq via ORAL
  Filled 2023-07-20 (×2): qty 2

## 2023-07-20 MED ORDER — ASPIRIN 81 MG PO TBEC
81.0000 mg | DELAYED_RELEASE_TABLET | Freq: Every day | ORAL | Status: DC
  Administered 2023-07-20 – 2023-07-22 (×3): 81 mg via ORAL
  Filled 2023-07-20 (×4): qty 1

## 2023-07-20 NOTE — Progress Notes (Addendum)
Patient Name: Suzanne Miller Date of Encounter: 07/20/2023 [facility] Cardiologist: Peter Swaziland, MD   Interval Summary  .    Sitting up in bed. More alert today, initially refused to take her pills but did agree.   Vital Signs .    Vitals:   07/19/23 2041 07/20/23 0043 07/20/23 0624 07/20/23 0810  BP: 115/75 (!) 113/58 (!) 137/50 (!) 119/50  Pulse: (!) 121 95 64 93  Resp: 20 20 20 18   Temp: 98 F (36.7 C) 98.2 F (36.8 C) 97.8 F (36.6 C) 97.7 F (36.5 C)  TempSrc: Oral Oral Oral Oral  SpO2: 94% 95% 94% 95%  Weight:  55.5 kg    Height:        Intake/Output Summary (Last 24 hours) at 07/20/2023 1017 Last data filed at 07/19/2023 2000 Gross per 24 hour  Intake 378.67 ml  Output 200 ml  Net 178.67 ml      07/20/2023   12:43 AM 07/31/23    9:14 AM 04/07/2023    2:58 PM  Last 3 Weights  Weight (lbs) 122 lb 5.7 oz 120 lb 133 lb  Weight (kg) 55.5 kg 54.432 kg 60.328 kg      Telemetry/ECG    Has not been on telemetry  Physical Exam .   GEN: No acute distress. HOH, more cooperative today Neck: No JVD Cardiac: Irreg Irreg, no murmurs, rubs, or gallops.  Respiratory: Clear to auscultation bilaterally. GI: Soft, nontender, non-distended  MS: No edema  Assessment & Plan .     89 y.o. female with a hx of CAD s/p PCI to RCA 1999, HTN, HLD, CKD II who was seen 07/31/23 for the evaluation of A fib at the request of Dr Jeraldine Loots.   Atrial fibrillation with rapid ventricular response, new diagnosis  -- Presented with elevated heart rate, she completely asymptomatic, she did not understand why she needs to be in the hospital, her daughter sent her here because she sounded weird on the phone -- on admission found to have severe hypokalemia 2.9, AKI with creatinine 3.74 and GFR 11, anion gap metabolic acidosis, and leukocytosis 12100 -- suspect atrial fibrillation in the setting of acute illness with COVID -- she has been refusing PO medications (did just now  agree to take meds), remains on IV Dilt with reasonably controlled HRs. Has not been on telemetry, suspect she pulled the leads off. If HR reasonably controlled and she continues to take oral meds, would transition off IV dilt  -- echo showed LVEF of 65-70%, moderate LVH, RV not well visualized  -- CHA2DS2-VASc Score = 4 , ideally recommend anticoagulation, but given her advanced age, decreased mobility/increased fall risk would not plan for anticoagulation as risk outweighs benefit    Per Primary AKI on CKD II Severe hypokalemia  Leukocytosis  HTN HLD COVID  For questions or updates, please contact [facility] Please consult [email or web address] for contact info under        Signed, Laverda Page, NP    ATTENDING ATTESTATION:  After conducting a review of all available clinical information with the care team, interviewing the patient, and performing a physical exam, I agree with the findings and plan described in this note.   GEN: No acute distress.   HEENT:  Dry MM, no JVD, no scleral icterus Cardiac: Irregular RR, no murmurs, rubs, or gallops.  Respiratory: Poor air movement GI: Soft, nontender, non-distended  MS: No edema; No deformity. Neuro:  Nonfocal  Vasc:  +2  radial pulses  Patient feels well today without significant shortness of breath, palpitations, or chest pain.  Her PCR was positive for COVID.  Her atrial fibrillation rates are better controlled on IV diltiazem.  Continue diltiazem infusion for now and will transition to p.o. soon.  Not a candidate for anticoagulation given gait instability, advanced age.  Alverda Skeans, MD Pager [phone]

## 2023-07-20 NOTE — Progress Notes (Signed)
PROGRESS NOTE    Suzanne Miller  UJW:[id number] DOB: 07/28/1930 DOA: 07/02/2023 PCP: Renford Dills, MD   Brief Narrative: Suzanne Miller is a 89 y.o. female with a history of CKD stage III, CAD status post PCI, hypertension, hyperlipidemia, anxiety.  Patient presented secondary to elevated heart rate was found to have evidence of atrial fibrillation with RVR and rates to the 200s.  Patient was started on Cardizem drip with improvement of heart rates.  Cardiologist consulted for management.  During admission, patient was found to be positive for COVID-19.  No treatment started.  Patient was also found to have an AKI likely related to hypoperfusion in setting of atrial fibrillation with RVR and dehydration.   Assessment and Plan:  AKI on CKD stage IIIb Most likely CKD stage IIIb based off of PCP note from 2023.  Per cardiology notes from 2023, creatinine as high as 1.78.  Creatinine of 3.74 on admission.  Presumed secondary to poor oral intake in setting of COVID-19 infection in addition to atrial fibrillation and possible cardiogenic etiology.  Patient managed with IV fluids with some improvement in renal function.  Creatinine down to 3.10 today. -Continue IV fluids  Atrial fibrillation with RVR Cardiology consulted.  Patient started on Cardizem drip with improvement of heart rates.  Echocardiogram obtained and was significant for evidence of normal LVEF of 65 to 70%, no aortic valve stenosis; left and right atria not well-visualized. -Cardiology recommendations: Continue Cardizem drip with plans to transition to oral, metoprolol twice daily  Chronic heart failure with preserved ejection fraction Stable.  Acute cystitis Patient started on treatment.  Urine culture obtained is significant for no growth.  Patient does have leukocytosis which is improving. -Continue ceftriaxone  Leukocytosis Unclear etiology but possibly related to presumed cystitis.  White blood cell count as high as  27,600.  COVID-19 infection Patient is COVID-positive.  No evidence of acute illness.  No treatment initiated.  Anemia of chronic disease Related to chronic kidney disease.  [place] of 12.1 on admission which was related to concentration dehydration hemoglobin dropped to 10.5 on 210.  No evidence of acute hemorrhage.    Thrombocytopenia Mild.  Likely related to acute illness.  Moderate tricuspid valve regurgitation Noted on echocardiogram.  Hypokalemia -Potassium supplementation  Primary hypertension -Continue diltiazem  CAD -Continue pravastatin and aspirin  Hyperlipidemia -Continue pravastatin  Anxiety -Continue Valium as needed  Pressure injury Sacrum.  Present on admission.    DVT prophylaxis: Lovenox Code Status:   Code Status: Full Code Family Communication: None at bedside Disposition Plan: Discharge to SNF pending ongoing cardiology recommendations and improvement of renal function   Consultants:  Cardiology  Procedures:  Echocardiogram  Antimicrobials: Ceftriaxone   Subjective: Patient reports no specific concerns this morning.  No issues from overnight.  Objective: BP 113/73 (BP Location: Left Arm)   Pulse 93   Temp 97.7 F (36.5 C) (Oral)   Resp 20   Ht 4\' 11"  (1.499 m)   Wt 55.5 kg   SpO2 95%   BMI 24.71 kg/m   Examination:  General exam: Appears calm and comfortable Respiratory system: Clear to auscultation. Respiratory effort normal. Cardiovascular system: S1 & S2 heard Gastrointestinal system: Abdomen is nondistended, soft and nontender. Normal bowel sounds heard. Central nervous system: Alert and oriented. Musculoskeletal: No edema. No calf tenderness Skin: No cyanosis. No rashes   Data Reviewed: I have personally reviewed following labs and imaging studies  CBC Lab Results  Component Value Date   WBC  13.6 (H) 07/20/2023   RBC 3.28 (L) 07/20/2023   HGB 10.0 (L) 07/20/2023   HCT 30.6 (L) 07/20/2023   MCV 93.3 07/20/2023    MCH 30.5 07/20/2023   PLT 148 (L) 07/20/2023   MCHC 32.7 07/20/2023   RDW 12.7 07/20/2023     Last metabolic panel Lab Results  Component Value Date   NA 141 07/20/2023   K 2.9 (L) 07/20/2023   CL 111 07/20/2023   CO2 16 (L) 07/20/2023   BUN 66 (H) 07/20/2023   CREATININE 3.10 (H) 07/20/2023   GLUCOSE 132 (H) 07/20/2023   GFRNONAA 14 (L) 07/20/2023   GFRAA 43 (L) 07/07/2016   CALCIUM 8.5 (L) 07/20/2023   PROT 6.9 04/22/2015   ALBUMIN 4.2 04/22/2015   BILITOT 0.5 04/22/2015   ALKPHOS 75 04/22/2015   AST 25 04/22/2015   ALT 24 04/22/2015   ANIONGAP 14 07/20/2023    GFR: Estimated Creatinine Clearance: 9.2 mL/min (A) (by C-G formula based on SCr of 3.1 mg/dL (H)).  Recent Results (from the past 240 hour(s))  Resp panel by RT-PCR (RSV, Flu A&B, Covid) Anterior Nasal Swab     Status: Abnormal   Collection Time: 06/30/2023  2:07 PM   Specimen: Anterior Nasal Swab  Result Value Ref Range Status   SARS Coronavirus 2 by RT PCR POSITIVE (A) NEGATIVE Final   Influenza A by PCR NEGATIVE NEGATIVE Final   Influenza B by PCR NEGATIVE NEGATIVE Final    Comment: (NOTE) The Xpert Xpress SARS-CoV-2/FLU/RSV plus assay is intended as an aid in the diagnosis of influenza from Nasopharyngeal swab specimens and should not be used as a sole basis for treatment. Nasal washings and aspirates are unacceptable for Xpert Xpress SARS-CoV-2/FLU/RSV testing.  Fact Sheet for Patients: BloggerCourse.com  Fact Sheet for Healthcare Providers: SeriousBroker.it  This test is not yet approved or cleared by the [place] FDA and has been authorized for detection and/or diagnosis of SARS-CoV-2 by FDA under an Emergency Use Authorization (EUA). This EUA will remain in effect (meaning this test can be used) for the duration of the COVID-19 declaration under Section 564(b)(1) of the Act, 21 U.S.C. section 360bbb-3(b)(1), unless the authorization is  terminated or revoked.     Resp Syncytial Virus by PCR NEGATIVE NEGATIVE Final    Comment: (NOTE) Fact Sheet for Patients: BloggerCourse.com  Fact Sheet for Healthcare Providers: SeriousBroker.it  This test is not yet approved or cleared by the [place] FDA and has been authorized for detection and/or diagnosis of SARS-CoV-2 by FDA under an Emergency Use Authorization (EUA). This EUA will remain in effect (meaning this test can be used) for the duration of the COVID-19 declaration under Section 564(b)(1) of the Act, 21 U.S.C. section 360bbb-3(b)(1), unless the authorization is terminated or revoked.  Performed at [facility], [address], [place], [place] [address]   Urine Culture (for pregnant, neutropenic or urologic patients or patients with an indwelling urinary catheter)     Status: None   Collection Time: 07/07/2023  5:53 PM   Specimen: Urine, Clean Catch  Result Value Ref Range Status   Specimen Description URINE, CLEAN CATCH  Final   Special Requests NONE  Final   Culture   Final    NO GROWTH Performed at [facility], [address], [place], [place] [address]    Report Status 07/20/2023 FINAL  Final      Radiology Studies: ECHOCARDIOGRAM COMPLETE  Result Date: 07/19/2023    ECHOCARDIOGRAM REPORT  Patient Name:   Suzanne Miller Date of Exam: 07/19/2023 Medical Rec #:  [id number]     Height:       59.0 in Accession #:    [id number]    Weight:       120.0 lb Date of Birth:  August 07, 1930    BSA:          1.484 m Patient Age:    89 years      BP:           143/53 mmHg Patient Gender: F             HR:           118 bpm. Exam Location:  Inpatient Procedure: 2D Echo, Color Doppler and Cardiac Doppler Indications:    I48.91* Unspecified atrial fibrillation  History:        Patient has no prior history of Echocardiogram examinations.                 CAD; Risk Factors:Hypertension and Dyslipidemia.  Sonographer:     Irving Burton Senior RDCS Referring Phys: [id number] Cyndi Bender  Sonographer Comments: Study incomplete, limited by patient cooperation. IMPRESSIONS  1. Left ventricular ejection fraction, by estimation, is 65 to 70%. The left ventricle has normal function. Left ventricular endocardial border not optimally defined to evaluate regional wall motion. There is moderate concentric left ventricular hypertrophy. Left ventricular diastolic function could not be evaluated.  2. Right ventricular systolic function was not well visualized. The right ventricular size is not well visualized. There is mildly elevated pulmonary artery systolic pressure. The estimated right ventricular systolic pressure is 37.7 mmHg.  3. 2D MVA 3.61 cm2 by planimetry. The mitral valve is degenerative. Mild mitral valve regurgitation. Mild mitral stenosis. Severe mitral annular calcification.  4. Tricuspid valve regurgitation is moderate.  5. The aortic valve is calcified. Aortic valve regurgitation is not visualized. Aortic valve sclerosis is present, with no evidence of aortic valve stenosis.  6. The inferior vena cava is dilated in size with <50% respiratory variability, suggesting right atrial pressure of 15 mmHg.  7. Limited study- patient unable to follow commans and cooperate during apical series and subcostal attempt. Comparison(s): No prior Echocardiogram. FINDINGS  Left Ventricle: Left ventricular ejection fraction, by estimation, is 65 to 70%. The left ventricle has normal function. Left ventricular endocardial border not optimally defined to evaluate regional wall motion. The left ventricular internal cavity size was small. There is moderate concentric left ventricular hypertrophy. Left ventricular diastolic function could not be evaluated due to atrial fibrillation. Left ventricular diastolic function could not be evaluated. Right Ventricle: The right ventricular size is not well visualized. Right vetricular wall thickness was not well visualized.  Right ventricular systolic function was not well visualized. There is mildly elevated pulmonary artery systolic pressure. The tricuspid regurgitant velocity is 2.38 m/s, and with an assumed right atrial pressure of 15 mmHg, the estimated right ventricular systolic pressure is 37.7 mmHg. Left Atrium: Left atrial size was not well visualized. Right Atrium: Right atrial size was not well visualized. Pericardium: There is no evidence of pericardial effusion. Mitral Valve: 2D MVA 3.61 cm2 by planimetry. The mitral valve is degenerative in appearance. Severe mitral annular calcification. Mild mitral valve regurgitation. Mild mitral valve stenosis. Tricuspid Valve: The tricuspid valve is normal in structure. Tricuspid valve regurgitation is moderate. Aortic Valve: The aortic valve is calcified. Aortic valve regurgitation is not visualized. Aortic valve sclerosis is present, with no evidence of aortic valve stenosis.  Pulmonic Valve: The pulmonic valve was normal in structure. Pulmonic valve regurgitation is not visualized. No evidence of pulmonic stenosis. Aorta: The aortic root and ascending aorta are structurally normal, with no evidence of dilitation. Venous: The inferior vena cava is dilated in size with less than 50% respiratory variability, suggesting right atrial pressure of 15 mmHg. IAS/Shunts: The interatrial septum was not well visualized.  LEFT VENTRICLE PLAX 2D LVIDd:         3.10 cm LVIDs:         2.00 cm LV PW:         1.10 cm LV IVS:        1.20 cm LVOT diam:     1.90 cm LVOT Area:     2.84 cm  LEFT ATRIUM         Index LA diam:    3.80 cm 2.56 cm/m   AORTA Ao Root diam: 2.60 cm Ao Asc diam:  3.60 cm TRICUSPID VALVE TR Peak grad:   22.7 mmHg TR Vmax:        238.00 cm/s  SHUNTS Systemic Diam: 1.90 cm Riley Lam MD Electronically signed by Riley Lam MD Signature Date/Time: 07/19/2023/4:42:06 PM    Final       LOS: 2 days    Jacquelin Hawking, MD [facility] 07/20/2023, 2:14  PM   If 7PM-7AM, please contact night-coverage [email or web address]

## 2023-07-20 NOTE — Evaluation (Signed)
Physical Therapy Evaluation Patient Details Name: Suzanne Miller MRN: [id number] DOB: 01-Sep-1930 Today's Date: 07/20/2023  History of Present Illness  [name] is a 89 y.o. F admitted 9/23  who presented due to weakness found to have Afib RVR and renal failure.  Also COVID positive. PMH: CKD IV at least (last baseline 1.7 in 2022), CAD s/p remote PCI, HTN, HLD, deaf, and anxiety  Clinical Impression  Pt admitted with above diagnosis. Pt confused however was able to get to eOB and stand to be cleaned. Pt could not answere questions about home situation due to confusion. Will likely need post acute rehab < 3 hours. Will follow acutely.  Pt currently with functional limitations due to the deficits listed below (see PT Problem List). Pt will benefit from acute skilled PT to increase their independence and safety with mobility to allow discharge.           If plan is discharge home, recommend the following: A lot of help with walking and/or transfers;A lot of help with bathing/dressing/bathroom;Assistance with cooking/housework;Assist for transportation;Help with stairs or ramp for entrance;Supervision due to cognitive status   Can travel by private vehicle   No    Equipment Recommendations Other (comment) (TBA)  Recommendations for Other Services       Functional Status Assessment Patient has had a recent decline in their functional status and demonstrates the ability to make significant improvements in function in a reasonable and predictable amount of time.     Precautions / Restrictions Precautions Precautions: Fall Precaution Comments: COVID, pt is deaf Restrictions Weight Bearing Restrictions: No      Mobility  Bed Mobility Overal bed mobility: Needs Assistance Bed Mobility: Supine to Sit     Supine to sit: Min assist     General bed mobility comments: Needed assist to initiate movement and to slide pt to eOB    Transfers Overall transfer level: Needs  assistance Equipment used: Rolling walker (2 wheels) Transfers: Sit to/from Stand Sit to Stand: Min assist           General transfer comment: Pt expressed concern on arrival stating "my bed is wet".  Obtained cloths and clean linens.  Pt stood to RW with min assist and was able to stand with CGA to be cleaned and bed linens changed.    Ambulation/Gait                  Stairs            Wheelchair Mobility     Tilt Bed    Modified Rankin (Stroke Patients Only)       Balance Overall balance assessment: Needs assistance Sitting-balance support: No upper extremity supported, Feet supported Sitting balance-Leahy Scale: Fair     Standing balance support: Bilateral upper extremity supported, During functional activity Standing balance-Leahy Scale: Poor Standing balance comment: relies on RW and UE support                             Pertinent Vitals/Pain Pain Assessment Pain Assessment: No/denies pain    Home Living Family/patient expects to be discharged to:: Private residence Living Arrangements: Alone Available Help at Discharge: Family;Available PRN/intermittently Type of Home: House             Additional Comments: No family present and pt cannot answer questions due to hearing deficit    Prior Function  Mobility Comments: Unsure as pt cant answer questions and no family present       Extremity/Trunk Assessment   Upper Extremity Assessment Upper Extremity Assessment: Defer to OT evaluation    Lower Extremity Assessment Lower Extremity Assessment: Generalized weakness    Cervical / Trunk Assessment Cervical / Trunk Assessment: Kyphotic  Communication   Communication Communication: Hearing impairment Cueing Techniques: Gestural cues;Tactile cues;Visual cues  Cognition Arousal: Alert Behavior During Therapy: Flat affect Overall Cognitive Status: History of cognitive impairments - at baseline Area of  Impairment: Safety/judgement, Problem solving, Following commands, Orientation                 Orientation Level: Disoriented to, Place, Time, Situation     Following Commands: Follows one step commands with increased time Safety/Judgement: Decreased awareness of safety, Decreased awareness of deficits   Problem Solving: Slow processing, Decreased initiation, Difficulty sequencing, Requires verbal cues, Requires tactile cues          General Comments General comments (skin integrity, edema, etc.): VSS    Exercises     Assessment/Plan    PT Assessment Patient needs continued PT services  PT Problem List Decreased activity tolerance;Decreased balance;Decreased mobility;Decreased knowledge of use of DME;Decreased safety awareness;Decreased knowledge of precautions;Cardiopulmonary status limiting activity       PT Treatment Interventions DME instruction;Gait training;Functional mobility training;Therapeutic activities;Therapeutic exercise;Balance training;Cognitive remediation;Patient/family education    PT Goals (Current goals can be found in the Care Plan section)  Acute Rehab PT Goals Patient Stated Goal: unable to assess PT Goal Formulation: Patient unable to participate in goal setting Time For Goal Achievement: 08/03/23 Potential to Achieve Goals: Fair    Frequency Min 1X/week     Co-evaluation               AM-PAC PT "6 Clicks" Mobility  Outcome Measure Help needed turning from your back to your side while in a flat bed without using bedrails?: A Little Help needed moving from lying on your back to sitting on the side of a flat bed without using bedrails?: A Little Help needed moving to and from a bed to a chair (including a wheelchair)?: A Lot Help needed standing up from a chair using your arms (e.g., wheelchair or bedside chair)?: A Little Help needed to walk in hospital room?: A Lot Help needed climbing 3-5 steps with a railing? : Total 6 Click  Score: 14    End of Session Equipment Utilized During Treatment: Gait belt Activity Tolerance: Patient limited by fatigue Patient left: with call bell/phone within reach;in bed;with bed alarm set Nurse Communication: Mobility status PT Visit Diagnosis: Unsteadiness on feet (R26.81);Muscle weakness (generalized) (M62.81)    Time: 5784-6962 PT Time Calculation (min) (ACUTE ONLY): 21 min   Charges:   PT Evaluation $PT Eval Moderate Complexity: 1 Mod   PT General Charges $$ ACUTE PT VISIT: 1 Visit         Daliya Parchment M,PT Acute Rehab Services [phone]   Bevelyn Buckles 07/20/2023, 12:21 PM

## 2023-07-20 NOTE — Evaluation (Addendum)
Occupational Therapy Evaluation Patient Details Name: Suzanne Miller MRN: [id number] DOB: 1930/07/20 Today's Date: 07/20/2023   History of Present Illness Suzanne Miller is a 89 y.o. F admitted 9/23  who presented due to weakness found to have Afib RVR and renal failure.  Also COVID positive. PMH: CKD IV at least (last baseline 1.7 in 2022), CAD s/p remote PCI, HTN, HLD, deaf, and anxiety   Clinical Impression   Per chart review, pt lives alone, performs functional mobility with a RW, and family checks on her. OT unable to get additional history secondary to communication barriers due to pt being hard of hearing. Pt now presents with decreased activity tolerance, generalized B UE weakness, impaired cognition, decreased balance during functional tasks, and decreased safety and independence with ADLs. Pt currently demonstrates ability to perform UB ADLs with Set up to Min assist, LB ADLs with Max to Max +2 assist, bed mobility during/in preparation for functional tasks with Min to Mod assist, and sit<->stand transfers with Min assist. Pt's VSS on RA throughout session. Pt participated well throughout OT eval. Pt will benefit from acute skilled OT services to address deficits outlined below, decrease caregiver burden, and increase safety and independence with functional tasks. Post acute discharge, pt will benefit from intensive inpatient skilled rehab services < 3 hours per day to maximize rehab potential.       If plan is discharge home, recommend the following: Two people to help with walking and/or transfers;Two people to help with bathing/dressing/bathroom;Assistance with cooking/housework;Assistance with feeding;Direct supervision/assist for medications management;Direct supervision/assist for financial management;Assist for transportation;Help with stairs or ramp for entrance (Set up for self-feeding. Frequent supervision due to current cognition.)    Functional Status Assessment  Patient has had a  recent decline in their functional status and demonstrates the ability to make significant improvements in function in a reasonable and predictable amount of time.  Equipment Recommendations  Other (comment) (defer to next level of care)    Recommendations for Other Services       Precautions / Restrictions Precautions Precautions: Fall Precaution Comments: COVID, pt is deaf Restrictions Weight Bearing Restrictions: No      Mobility Bed Mobility Overal bed mobility: Needs Assistance Bed Mobility: Supine to Sit, Sit to Supine     Supine to sit: Min assist Sit to supine: Mod assist   General bed mobility comments: Needed assist to initiate movement and to slide pt to EOB. Needs assit to lift LE and manage trunck going sit to supine.    Transfers Overall transfer level: Needs assistance Equipment used: Rolling walker (2 wheels) Transfers: Sit to/from Stand Sit to Stand: Min assist           General transfer comment: Deferred fuctional transfers this session for pt/therapist safety due to pt expressing fear of falling and increased signs of anxiety. Pt will benefit from +2 for safety with functional transfers.      Balance Overall balance assessment: Needs assistance Sitting-balance support: No upper extremity supported, Feet supported Sitting balance-Leahy Scale: Fair     Standing balance support: Bilateral upper extremity supported, Reliant on assistive device for balance, During functional activity Standing balance-Leahy Scale: Poor Standing balance comment: relies on RW and UE support                           ADL either performed or assessed with clinical judgement   ADL Overall ADL's : Needs assistance/impaired Eating/Feeding: Set up;Sitting Eating/Feeding Details (indicate  cue type and reason): sitting with back supported Grooming: Oral care;Wash/dry hands;Wash/dry face;Minimal assistance;Sitting Grooming Details (indicate cue type and reason):  sitting with back supported Upper Body Bathing: Minimal assistance;Sitting Upper Body Bathing Details (indicate cue type and reason): sitting with back supported Lower Body Bathing: Sit to/from stand;Maximal assistance;+2 for safety/equipment   Upper Body Dressing : Minimal assistance;Sitting Upper Body Dressing Details (indicate cue type and reason): sitting with back supported Lower Body Dressing: Maximal assistance;+2 for safety/equipment;Sit to/from stand       Toileting- Architect and Hygiene: Maximal assistance;Bed level         General ADL Comments: Pt with decreased activity tolerance and frequently reporting fear of falling or being left alone during functional tasks. Pt benefited from reassurance that OT would stay with her during tasks and that staff want her to be safe and receive good care.     Vision         Perception         Praxis         Pertinent Vitals/Pain Pain Assessment Pain Assessment: No/denies pain     Extremity/Trunk Assessment Upper Extremity Assessment Upper Extremity Assessment: Generalized weakness   Lower Extremity Assessment Lower Extremity Assessment: Defer to PT evaluation   Cervical / Trunk Assessment Cervical / Trunk Assessment: Kyphotic   Communication Communication Communication: Hearing impairment (Pt requiring significantly increased volume. OT with CAPR on throughout session due to pt on airborne and contact precautions with pt appearing to benefit somewhat from OT facing pt and pt being able to see OT's face when speaking.) Cueing Techniques: Gestural cues;Tactile cues;Visual cues   Cognition Arousal: Alert Behavior During Therapy: Anxious Overall Cognitive Status: History of cognitive impairments - at baseline Area of Impairment: Safety/judgement, Problem solving, Following commands, Orientation                 Orientation Level: Disoriented to, Place, Time, Situation     Following Commands:  Follows one step commands with increased time Safety/Judgement: Decreased awareness of safety, Decreased awareness of deficits   Problem Solving: Slow processing, Decreased initiation, Difficulty sequencing, Requires tactile cues (Gestural cues, tactile cues) General Comments: Pleasant throughout session but frequenting expressing worry and asking OT to not leave her alone. Pt responded well to reassurance.     General Comments  VSS on RA throughout session    Exercises     Shoulder Instructions      Home Living Family/patient expects to be discharged to:: Private residence Living Arrangements: Alone Available Help at Discharge: Family;Available PRN/intermittently Type of Home: House                           Additional Comments: No family present and pt cannot answer questions due to hearing deficit      Prior Functioning/Environment Prior Level of Function : Other (comment) Weldon Inches of PLOF due to communicaiton deficts and no family available this day. Per chart review, pt typically lives alone, uses a RW, and nephew and daughter check in on her.)                        OT Problem List: Decreased strength;Decreased activity tolerance;Impaired balance (sitting and/or standing);Decreased knowledge of use of DME or AE;Decreased safety awareness      OT Treatment/Interventions: Self-care/ADL training;Therapeutic exercise;DME and/or AE instruction;Therapeutic activities;Patient/family education;Balance training    OT Goals(Current goals can be found in the care plan section) Acute  Rehab OT Goals Patient Stated Goal: to feel better and return home OT Goal Formulation: With patient Time For Goal Achievement: 08/03/23 Potential to Achieve Goals: Good ADL Goals Pt Will Perform Lower Body Bathing: with min assist;sit to/from stand Pt Will Perform Upper Body Dressing: with supervision;sitting Pt Will Perform Lower Body Dressing: with contact guard assist;sit  to/from stand Pt Will Transfer to Toilet: with contact guard assist;ambulating;bedside commode (with least restrictive AD) Pt Will Perform Toileting - Clothing Manipulation and hygiene: with min assist;sit to/from stand;sitting/lateral leans Pt/caregiver will Perform Home Exercise Program: Increased strength;Both right and left upper extremity;With theraband;With theraputty;With Supervision;With written HEP provided (Increased activity tolerance)  OT Frequency: Min 1X/week    Co-evaluation              AM-PAC OT "6 Clicks" Daily Activity     Outcome Measure Help from another person eating meals?: A Little Help from another person taking care of personal grooming?: A Little Help from another person toileting, which includes using toliet, bedpan, or urinal?: A Lot Help from another person bathing (including washing, rinsing, drying)?: A Lot Help from another person to put on and taking off regular upper body clothing?: A Little Help from another person to put on and taking off regular lower body clothing?: A Lot 6 Click Score: 15   End of Session Equipment Utilized During Treatment: Rolling walker (2 wheels);Gait belt Nurse Communication: Mobility status  Activity Tolerance: Patient tolerated treatment well Patient left: in bed;with call bell/phone within reach;with bed alarm set  OT Visit Diagnosis: Unsteadiness on feet (R26.81);Muscle weakness (generalized) (M62.81)                Time: 6295-2841 OT Time Calculation (min): 49 min Charges:  OT General Charges $OT Visit: 1 Visit OT Evaluation $OT Eval Moderate Complexity: 1 Mod OT Treatments $Self Care/Home Management : 23-37 mins  [address]Orson Eva., OTR/L, MA Acute Rehab [phone] Addendum to correct incorrectly entered time entry:  Dylan Monforte "Kyle" M., OTR/L, MA Acute Rehab [phone]  Lendon Colonel 07/20/2023, 6:34 PM

## 2023-07-20 NOTE — Consult Note (Signed)
[facility] In[facility] Consult   07/20/2023  WESLYN RYALL Nov 17, 1929 [id number]  [facility] [[facility]]  Accountable Care Organization [ACO] Patient: [employer] Medicare  Primary Care Provider: Renford Dills, MD is an [facility] provider that is listed for the transition of care follow up appointments and calls.   RN Hospital Liaison screened the patient remotely at [facility]. PPE preserved for inpatient hospital staff.   The patient was screened for hospitalization with noted medium risk score for unplanned readmission risk as case reviewed in unit morning progression meeting.  The patient was assessed for potential [facility]) Care Management service needs for post hospital transition for care coordination. Review of patient's electronic medical record reveals patient is from home alone and nephew lives nearby as discussed in progression meeting, as patient is deaf.  Currently, disposition is unclear.  Plan: [facility] Liaison will continue to follow progress and disposition to asess for post hospital community care coordination/management needs.  Referral request for community care coordination: pending disposition.   Community Care Management/Population Health does not replace or interfere with any arrangements made by the Inpatient Transition of Care team.   For questions contact:   Charlesetta Shanks, RN, BSN, CCM [place]  Ms [facility], [facility] Liaison Direct Dial: [phone] or secure chat Website: [email or web address]

## 2023-07-20 NOTE — Plan of Care (Signed)

## 2023-07-20 NOTE — Progress Notes (Signed)
2038: Spoke w/ daughter and gave update that patient is refusing medications and seems concerned about her swallowing. Offered to crush what pills we could and patient still refused. Per daughter, she states she watched her mother eat fine and normally has no issue swallowing. Per daughter, patient has become paranoid about taking pills as patient's mother choked on a pill in a nursing facility and passed away.

## 2023-07-21 DIAGNOSIS — N1832 Chronic kidney disease, stage 3b: Secondary | ICD-10-CM | POA: Diagnosis not present

## 2023-07-21 DIAGNOSIS — I4891 Unspecified atrial fibrillation: Secondary | ICD-10-CM | POA: Diagnosis not present

## 2023-07-21 DIAGNOSIS — N189 Chronic kidney disease, unspecified: Secondary | ICD-10-CM | POA: Diagnosis not present

## 2023-07-21 DIAGNOSIS — U071 COVID-19: Secondary | ICD-10-CM | POA: Diagnosis not present

## 2023-07-21 DIAGNOSIS — N179 Acute kidney failure, unspecified: Secondary | ICD-10-CM | POA: Diagnosis not present

## 2023-07-21 LAB — CBC
HCT: 31.4 % — ABNORMAL LOW (ref 36.0–46.0)
Hemoglobin: 10 g/dL — ABNORMAL LOW (ref 12.0–15.0)
MCH: 30.1 pg (ref 26.0–34.0)
MCHC: 31.8 g/dL (ref 30.0–36.0)
MCV: 94.6 fL (ref 80.0–100.0)
Platelets: 165 10*3/uL (ref 150–400)
RBC: 3.32 MIL/uL — ABNORMAL LOW (ref 3.87–5.11)
RDW: 12.9 % (ref 11.5–15.5)
WBC: 9.5 10*3/uL (ref 4.0–10.5)
nRBC: 0 % (ref 0.0–0.2)

## 2023-07-21 LAB — BASIC METABOLIC PANEL
Anion gap: 14 (ref 5–15)
BUN: 57 mg/dL — ABNORMAL HIGH (ref 8–23)
CO2: 14 mmol/L — ABNORMAL LOW (ref 22–32)
Calcium: 8.6 mg/dL — ABNORMAL LOW (ref 8.9–10.3)
Chloride: 116 mmol/L — ABNORMAL HIGH (ref 98–111)
Creatinine, Ser: 2.73 mg/dL — ABNORMAL HIGH (ref 0.44–1.00)
GFR, Estimated: 16 mL/min — ABNORMAL LOW (ref >60)
Glucose, Bld: 112 mg/dL — ABNORMAL HIGH (ref 70–99)
Potassium: 3.1 mmol/L — ABNORMAL LOW (ref 3.5–5.1)
Sodium: 144 mmol/L (ref 135–145)

## 2023-07-21 MED ORDER — DILTIAZEM HCL ER 60 MG PO CP12: 120.0000 mg | ORAL_CAPSULE | Freq: Two times a day (BID) | ORAL | Status: DC

## 2023-07-21 MED ORDER — POTASSIUM CHLORIDE 20 MEQ PO PACK: 40.0000 meq | PACK | ORAL | Status: AC

## 2023-07-21 MED ORDER — DILTIAZEM HCL ER 60 MG PO CP12
120.0000 mg | ORAL_CAPSULE | Freq: Two times a day (BID) | ORAL | Status: DC
  Administered 2023-07-21: 120 mg via ORAL
  Filled 2023-07-21 (×3): qty 2

## 2023-07-21 NOTE — Progress Notes (Signed)
Patient remains confused and refuses night meds stating that she has already taken her medications for the day. Tried to explain that these medications are needed again. Patient continues to refuse. On-call provider notified.

## 2023-07-21 NOTE — Plan of Care (Signed)

## 2023-07-21 NOTE — Progress Notes (Signed)
PROGRESS NOTE    Suzanne Miller  ZOX:[id number] DOB: [birth year] DOA: 2023-08-12 PCP: Renford Dills, MD   Brief Narrative: Suzanne Miller is a 89 y.o. female with a history of CKD stage III, CAD status post PCI, hypertension, hyperlipidemia, anxiety.  Patient presented secondary to elevated heart rate was found to have evidence of atrial fibrillation with RVR and rates to the 200s.  Patient was started on Cardizem drip with improvement of heart rates.  Cardiologist consulted for management.  During admission, patient was found to be positive for COVID-19.  No treatment started.  Patient was also found to have an AKI likely related to hypoperfusion in setting of atrial fibrillation with RVR and dehydration.   Assessment and Plan:  AKI on CKD stage IIIb Most likely CKD stage IIIb based off of PCP note from 2023.  Per cardiology notes from 2023, creatinine as high as 1.78.  Creatinine of 3.74 on admission.  Presumed secondary to poor oral intake in setting of COVID-19 infection in addition to atrial fibrillation and possible cardiogenic etiology.  Patient managed with IV fluids with some improvement in renal function.  Creatinine down to 2.73 today. -Continue IV fluids  Atrial fibrillation with RVR Cardiology consulted.  Patient started on Cardizem drip with improvement of heart rates.  Echocardiogram obtained and was significant for evidence of normal LVEF of 65 to 70%, no aortic valve stenosis; left and right atria not well-visualized. -Cardiology recommendations: Diltiazem PO, wean diltiazem drip, discontinue metoprolol  Chronic heart failure with preserved ejection fraction Stable.  Acute cystitis Patient started on treatment.  Urine culture obtained is significant for no growth.  Patient does have leukocytosis which is improving. -Continue ceftriaxone  Leukocytosis Unclear etiology but possibly related to presumed cystitis.  White blood cell count as high as 27,600.  COVID-19  infection Patient is COVID-positive.  No evidence of acute illness.  No treatment initiated.  Anemia of chronic disease Related to chronic kidney disease.  [place] of 12.1 on admission which was related to concentration dehydration hemoglobin dropped to 10.5 on 210.  No evidence of acute hemorrhage. Stable.  Thrombocytopenia Mild.  Likely related to acute illness. Resolved.  Moderate tricuspid valve regurgitation Noted on echocardiogram.  Hypokalemia -Potassium supplementation  Primary hypertension -Continue diltiazem  CAD -Continue pravastatin and aspirin  Hyperlipidemia -Continue pravastatin  Anxiety -Continue Valium as needed  Pressure injury Sacrum.  Present on admission.    DVT prophylaxis: Lovenox Code Status:   Code Status: Full Code Family Communication: None at bedside Disposition Plan: Discharge to SNF pending ongoing cardiology recommendations and improvement of renal function   Consultants:  Cardiology  Procedures:  Echocardiogram  Antimicrobials: Ceftriaxone   Subjective: Patient reports no specific concerns this morning.  No issues from overnight.  Objective: BP (!) 143/83 (BP Location: Left Arm)   Pulse 78   Temp 97.6 F (36.4 C) (Oral)   Resp 18   Ht 4\' 11"  (1.499 m)   Wt 55.9 kg   SpO2 95%   BMI 24.89 kg/m   Examination:  General exam: Appears calm and comfortable Respiratory system: Clear to auscultation. Respiratory effort normal. Cardiovascular system: S1 & S2 heard, irregular rhythm, fast rate. Gastrointestinal system: Abdomen is nondistended, soft and nontender. Normal bowel sounds heard. Central nervous system: Alert and oriented. No focal neurological deficits. Musculoskeletal: No edema. No calf tenderness Psychiatry: Judgement and insight appear normal. Mood & affect appropriate.    Data Reviewed: I have personally reviewed following labs and imaging studies  CBC Lab Results  Component Value Date   WBC 9.5  07/21/2023   RBC 3.32 (L) 07/21/2023   HGB 10.0 (L) 07/21/2023   HCT 31.4 (L) 07/21/2023   MCV 94.6 07/21/2023   MCH 30.1 07/21/2023   PLT 165 07/21/2023   MCHC 31.8 07/21/2023   RDW 12.9 07/21/2023     Last metabolic panel Lab Results  Component Value Date   NA 144 07/21/2023   K 3.1 (L) 07/21/2023   CL 116 (H) 07/21/2023   CO2 14 (L) 07/21/2023   BUN 57 (H) 07/21/2023   CREATININE 2.73 (H) 07/21/2023   GLUCOSE 112 (H) 07/21/2023   GFRNONAA 16 (L) 07/21/2023   GFRAA 43 (L) 07/07/2016   CALCIUM 8.6 (L) 07/21/2023   PROT 6.9 04/22/2015   ALBUMIN 4.2 04/22/2015   BILITOT 0.5 04/22/2015   ALKPHOS 75 04/22/2015   AST 25 04/22/2015   ALT 24 04/22/2015   ANIONGAP 14 07/21/2023    GFR: Estimated Creatinine Clearance: 10.4 mL/min (A) (by C-G formula based on SCr of 2.73 mg/dL (H)).  Recent Results (from the past 240 hour(s))  Resp panel by RT-PCR (RSV, Flu A&B, Covid) Anterior Nasal Swab     Status: Abnormal   Collection Time: 07/21/2023  2:07 PM   Specimen: Anterior Nasal Swab  Result Value Ref Range Status   SARS Coronavirus 2 by RT PCR POSITIVE (A) NEGATIVE Final   Influenza A by PCR NEGATIVE NEGATIVE Final   Influenza B by PCR NEGATIVE NEGATIVE Final    Comment: (NOTE) The Xpert Xpress SARS-CoV-2/FLU/RSV plus assay is intended as an aid in the diagnosis of influenza from Nasopharyngeal swab specimens and should not be used as a sole basis for treatment. Nasal washings and aspirates are unacceptable for Xpert Xpress SARS-CoV-2/FLU/RSV testing.  Fact Sheet for Patients: BloggerCourse.com  Fact Sheet for Healthcare Providers: SeriousBroker.it  This test is not yet approved or cleared by the [place] FDA and has been authorized for detection and/or diagnosis of SARS-CoV-2 by FDA under an Emergency Use Authorization (EUA). This EUA will remain in effect (meaning this test can be used) for the duration of  the COVID-19 declaration under Section 564(b)(1) of the Act, 21 U.S.C. section 360bbb-3(b)(1), unless the authorization is terminated or revoked.     Resp Syncytial Virus by PCR NEGATIVE NEGATIVE Final    Comment: (NOTE) Fact Sheet for Patients: BloggerCourse.com  Fact Sheet for Healthcare Providers: SeriousBroker.it  This test is not yet approved or cleared by the [place] FDA and has been authorized for detection and/or diagnosis of SARS-CoV-2 by FDA under an Emergency Use Authorization (EUA). This EUA will remain in effect (meaning this test can be used) for the duration of the COVID-19 declaration under Section 564(b)(1) of the Act, 21 U.S.C. section 360bbb-3(b)(1), unless the authorization is terminated or revoked.  Performed at [facility], [address], [place], [place] [address]   Urine Culture (for pregnant, neutropenic or urologic patients or patients with an indwelling urinary catheter)     Status: None   Collection Time: 07/05/2023  5:53 PM   Specimen: Urine, Clean Catch  Result Value Ref Range Status   Specimen Description URINE, CLEAN CATCH  Final   Special Requests NONE  Final   Culture   Final    NO GROWTH Performed at [facility], [address], [place], [place] [address]    Report Status 07/20/2023 FINAL  Final      Radiology Studies: ECHOCARDIOGRAM COMPLETE  Result Date: 07/19/2023    ECHOCARDIOGRAM REPORT   Patient Name:   Suzanne Miller Date of Exam: 07/19/2023 Medical Rec #:  [id number]     Height:       59.0 in Accession #:    [id number]    Weight:       120.0 lb Date of Birth:  08/18/1930    BSA:          1.484 m Patient Age:    89 years      BP:           143/53 mmHg Patient Gender: F             HR:           118 bpm. Exam Location:  Inpatient Procedure: 2D Echo, Color Doppler and Cardiac Doppler Indications:    I48.91* Unspecified atrial fibrillation  History:        Patient has no  prior history of Echocardiogram examinations.                 CAD; Risk Factors:Hypertension and Dyslipidemia.  Sonographer:    Irving Burton Senior RDCS Referring Phys: [id number] Cyndi Bender  Sonographer Comments: Study incomplete, limited by patient cooperation. IMPRESSIONS  1. Left ventricular ejection fraction, by estimation, is 65 to 70%. The left ventricle has normal function. Left ventricular endocardial border not optimally defined to evaluate regional wall motion. There is moderate concentric left ventricular hypertrophy. Left ventricular diastolic function could not be evaluated.  2. Right ventricular systolic function was not well visualized. The right ventricular size is not well visualized. There is mildly elevated pulmonary artery systolic pressure. The estimated right ventricular systolic pressure is 37.7 mmHg.  3. 2D MVA 3.61 cm2 by planimetry. The mitral valve is degenerative. Mild mitral valve regurgitation. Mild mitral stenosis. Severe mitral annular calcification.  4. Tricuspid valve regurgitation is moderate.  5. The aortic valve is calcified. Aortic valve regurgitation is not visualized. Aortic valve sclerosis is present, with no evidence of aortic valve stenosis.  6. The inferior vena cava is dilated in size with <50% respiratory variability, suggesting right atrial pressure of 15 mmHg.  7. Limited study- patient unable to follow commans and cooperate during apical series and subcostal attempt. Comparison(s): No prior Echocardiogram. FINDINGS  Left Ventricle: Left ventricular ejection fraction, by estimation, is 65 to 70%. The left ventricle has normal function. Left ventricular endocardial border not optimally defined to evaluate regional wall motion. The left ventricular internal cavity size was small. There is moderate concentric left ventricular hypertrophy. Left ventricular diastolic function could not be evaluated due to atrial fibrillation. Left ventricular diastolic function could not be  evaluated. Right Ventricle: The right ventricular size is not well visualized. Right vetricular wall thickness was not well visualized. Right ventricular systolic function was not well visualized. There is mildly elevated pulmonary artery systolic pressure. The tricuspid regurgitant velocity is 2.38 m/s, and with an assumed right atrial pressure of 15 mmHg, the estimated right ventricular systolic pressure is 37.7 mmHg. Left Atrium: Left atrial size was not well visualized. Right Atrium: Right atrial size was not well visualized. Pericardium: There is no evidence of pericardial effusion. Mitral Valve: 2D MVA 3.61 cm2 by planimetry. The mitral valve is degenerative in appearance. Severe mitral annular calcification. Mild mitral valve regurgitation. Mild mitral valve stenosis. Tricuspid Valve: The tricuspid valve is normal in structure. Tricuspid valve regurgitation is moderate. Aortic Valve: The aortic valve is calcified. Aortic valve regurgitation is not visualized. Aortic valve  sclerosis is present, with no evidence of aortic valve stenosis. Pulmonic Valve: The pulmonic valve was normal in structure. Pulmonic valve regurgitation is not visualized. No evidence of pulmonic stenosis. Aorta: The aortic root and ascending aorta are structurally normal, with no evidence of dilitation. Venous: The inferior vena cava is dilated in size with less than 50% respiratory variability, suggesting right atrial pressure of 15 mmHg. IAS/Shunts: The interatrial septum was not well visualized.  LEFT VENTRICLE PLAX 2D LVIDd:         3.10 cm LVIDs:         2.00 cm LV PW:         1.10 cm LV IVS:        1.20 cm LVOT diam:     1.90 cm LVOT Area:     2.84 cm  LEFT ATRIUM         Index LA diam:    3.80 cm 2.56 cm/m   AORTA Ao Root diam: 2.60 cm Ao Asc diam:  3.60 cm TRICUSPID VALVE TR Peak grad:   22.7 mmHg TR Vmax:        238.00 cm/s  SHUNTS Systemic Diam: 1.90 cm Riley Lam MD Electronically signed by Riley Lam MD  Signature Date/Time: 07/19/2023/4:42:06 PM    Final       LOS: 3 days    Jacquelin Hawking, MD [facility] 07/21/2023, 12:49 PM   If 7PM-7AM, please contact night-coverage [email or web address]

## 2023-07-21 NOTE — Progress Notes (Addendum)
Patient Name: Suzanne Miller Date of Encounter: 07/21/2023 [facility] Cardiologist: Peter Swaziland, MD   Interval Summary  .    No issues overnight.  Feels fine, breathing well.  Agreeable to taking pills.  Vital Signs .    Vitals:   07/21/23 0448 07/21/23 0742 07/21/23 0747 07/21/23 1107  BP: (!) 157/96 (!) 143/97  (!) 143/83  Pulse:  83  78  Resp: 20 (!) 21 19 18   Temp: (!) 97 F (36.1 C) 98.1 F (36.7 C)  97.6 F (36.4 C)  TempSrc: Oral Oral  Oral  SpO2: 96% 91%  95%  Weight: 55.9 kg     Height:        Intake/Output Summary (Last 24 hours) at 07/21/2023 1118 Last data filed at 07/21/2023 1100 Gross per 24 hour  Intake 3699.72 ml  Output 300 ml  Net 3399.72 ml      07/21/2023    4:48 AM 07/20/2023   12:43 AM 07/22/23    9:14 AM  Last 3 Weights  Weight (lbs) 123 lb 3.8 oz 122 lb 5.7 oz 120 lb  Weight (kg) 55.9 kg 55.5 kg 54.432 kg      Telemetry/ECG    AF with RVR at times  Physical Exam .   GEN: No acute distress.  Neck: No JVD Cardiac: Irregular rate and rhythm, no murmurs, rubs, or gallops.  Respiratory: Poor air movement bilaterally GI: Soft, nontender, non-distended  MS: No edema  Assessment & Plan .     89 y.o. female with a hx of CAD s/p PCI to RCA 1999, HTN, HLD, CKD II who was seen 07/22/23 for the evaluation of A fib at the request of Dr Jeraldine Loots.   Atrial fibrillation with rapid ventricular response, new diagnosis:  Cont diltiazem infusion.  Will d/c lopressor and start PO diltiazem 180 BID and hopefully can wean gtt.  Not a candidate for A/C.   AKI on CKD II:  Hold nephrotoxins; Cr mildly improved  COVID:  Per primary team  For questions or updates, please contact [facility] Please consult [email or web address] for contact info under        Signed, Orbie Pyo, MD

## 2023-07-22 DIAGNOSIS — N189 Chronic kidney disease, unspecified: Secondary | ICD-10-CM | POA: Diagnosis not present

## 2023-07-22 DIAGNOSIS — U071 COVID-19: Secondary | ICD-10-CM | POA: Diagnosis not present

## 2023-07-22 DIAGNOSIS — N179 Acute kidney failure, unspecified: Secondary | ICD-10-CM | POA: Diagnosis not present

## 2023-07-22 DIAGNOSIS — N1832 Chronic kidney disease, stage 3b: Secondary | ICD-10-CM | POA: Diagnosis not present

## 2023-07-22 DIAGNOSIS — I4891 Unspecified atrial fibrillation: Secondary | ICD-10-CM | POA: Diagnosis not present

## 2023-07-22 LAB — BASIC METABOLIC PANEL
Anion gap: 14 (ref 5–15)
BUN: 52 mg/dL — ABNORMAL HIGH (ref 8–23)
CO2: 12 mmol/L — ABNORMAL LOW (ref 22–32)
Calcium: 8.4 mg/dL — ABNORMAL LOW (ref 8.9–10.3)
Chloride: 117 mmol/L — ABNORMAL HIGH (ref 98–111)
Creatinine, Ser: 2.64 mg/dL — ABNORMAL HIGH (ref 0.44–1.00)
GFR, Estimated: 17 mL/min — ABNORMAL LOW (ref >60)
Glucose, Bld: 125 mg/dL — ABNORMAL HIGH (ref 70–99)
Potassium: 3.4 mmol/L — ABNORMAL LOW (ref 3.5–5.1)
Sodium: 143 mmol/L (ref 135–145)

## 2023-07-22 MED ORDER — DILTIAZEM HCL ER COATED BEADS 240 MG PO CP24
240.0000 mg | ORAL_CAPSULE | Freq: Every day | ORAL | Status: DC
  Administered 2023-07-22: 240 mg via ORAL
  Filled 2023-07-22 (×2): qty 1

## 2023-07-22 MED ORDER — DILTIAZEM HCL-DEXTROSE 125-5 MG/125ML-% IV SOLN (PREMIX)
5.0000 mg/h | INTRAVENOUS | Status: DC
  Administered 2023-07-22: 15 mg/h via INTRAVENOUS
  Administered 2023-07-22 – 2023-07-23 (×2): 5 mg/h via INTRAVENOUS
  Administered 2023-07-23 – 2023-07-24 (×3): 15 mg/h via INTRAVENOUS
  Administered 2023-07-24: 10 mg/h via INTRAVENOUS
  Filled 2023-07-22 (×4): qty 125

## 2023-07-22 MED ORDER — SODIUM BICARBONATE 8.4 % IV SOLN
INTRAVENOUS | Status: DC
  Filled 2023-07-22: qty 1000
  Filled 2023-07-22: qty 150

## 2023-07-22 NOTE — Progress Notes (Signed)
PROGRESS NOTE    Suzanne Miller  JOA:[id number] DOB: 1930-10-22 DOA: Aug 12, 2023 PCP: Renford Dills, MD   Brief Narrative: Suzanne Miller is a 89 y.o. female with a history of CKD stage III, CAD status post PCI, hypertension, hyperlipidemia, anxiety.  Patient presented secondary to elevated heart rate was found to have evidence of atrial fibrillation with RVR and rates to the 200s.  Patient was started on Cardizem drip with improvement of heart rates.  Cardiologist consulted for management.  During admission, patient was found to be positive for COVID-19.  No treatment started.  Patient was also found to have an AKI likely related to hypoperfusion in setting of atrial fibrillation with RVR and dehydration.   Assessment and Plan:  AKI on CKD stage IIIb Most likely CKD stage IIIb based off of PCP note from 2023.  Per cardiology notes from 2023, creatinine as high as 1.78.  Creatinine of 3.74 on admission.  Presumed secondary to poor oral intake in setting of COVID-19 infection in addition to atrial fibrillation and possible cardiogenic etiology.  Patient managed with IV fluids with some improvement in renal function.  Creatinine down to 2.64 today. -Continue IV fluids (switched to sodium bicarb)  Atrial fibrillation with RVR Cardiology consulted.  Patient started on Cardizem drip with improvement of heart rates.  Echocardiogram obtained and was significant for evidence of normal LVEF of 65 to 70%, no aortic valve stenosis; left and right atria not well-visualized. -Cardiology recommendations: Diltiazem PO, discontinue metoprolol  Chronic heart failure with preserved ejection fraction Stable.  Acute cystitis Patient started on treatment.  Urine culture obtained is significant for no growth.  Patient does have leukocytosis which is improving. -Continue ceftriaxone  Metabolic acidosis Secondary to renal failure -Sodium bicarb IV fluids -BMP in AM  Leukocytosis Unclear etiology but  possibly related to presumed cystitis.  White blood cell count as high as 27,600. Now resolved.  COVID-19 infection Patient is COVID-positive.  No evidence of acute illness.  No treatment initiated.  Anemia of chronic disease Related to chronic kidney disease.  [place] of 12.1 on admission which was related to concentration dehydration hemoglobin dropped to 10.5 on 210.  No evidence of acute hemorrhage. Stable.  Thrombocytopenia Mild.  Likely related to acute illness. Resolved.  Moderate tricuspid valve regurgitation Noted on echocardiogram.  Hypokalemia -Potassium supplementation  Primary hypertension -Continue diltiazem  CAD -Continue pravastatin and aspirin  Hyperlipidemia -Continue pravastatin  Anxiety -Continue Valium as needed  Pressure injury Sacrum.  Present on admission.    DVT prophylaxis: Lovenox Code Status:   Code Status: Full Code Family Communication: None at bedside Disposition Plan: Discharge to SNF pending ongoing cardiology recommendations and improvement of renal function   Consultants:  Cardiology  Procedures:  Echocardiogram  Antimicrobials: Ceftriaxone   Subjective: No specific concerns.  Objective: BP (!) 129/45 (BP Location: Right Arm)   Pulse 62   Temp (!) 97.5 F (36.4 C) (Oral)   Resp 17   Ht 4\' 11"  (1.499 m)   Wt 55.9 kg   SpO2 93%   BMI 24.89 kg/m   Examination:  General exam: Appears calm and comfortable Respiratory system: Clear to auscultation. Respiratory effort normal. Cardiovascular system: S1 & S2 heard, irregular rhythm, normal rate Gastrointestinal system: Abdomen is nondistended, soft and nontender. Normal bowel sounds heard. Central nervous system: Alert and oriented to person and place. Musculoskeletal: No edema. No calf tenderness  Data Reviewed: I have personally reviewed following labs and imaging studies  CBC Lab Results  Component Value Date   WBC 9.5 07/21/2023   RBC 3.32 (L) 07/21/2023    HGB 10.0 (L) 07/21/2023   HCT 31.4 (L) 07/21/2023   MCV 94.6 07/21/2023   MCH 30.1 07/21/2023   PLT 165 07/21/2023   MCHC 31.8 07/21/2023   RDW 12.9 07/21/2023     Last metabolic panel Lab Results  Component Value Date   NA 143 07/22/2023   K 3.4 (L) 07/22/2023   CL 117 (H) 07/22/2023   CO2 12 (L) 07/22/2023   BUN 52 (H) 07/22/2023   CREATININE 2.64 (H) 07/22/2023   GLUCOSE 125 (H) 07/22/2023   GFRNONAA 17 (L) 07/22/2023   GFRAA 43 (L) 07/07/2016   CALCIUM 8.4 (L) 07/22/2023   PROT 6.9 04/22/2015   ALBUMIN 4.2 04/22/2015   BILITOT 0.5 04/22/2015   ALKPHOS 75 04/22/2015   AST 25 04/22/2015   ALT 24 04/22/2015   ANIONGAP 14 07/22/2023    GFR: Estimated Creatinine Clearance: 10.8 mL/min (A) (by C-G formula based on SCr of 2.64 mg/dL (H)).  Recent Results (from the past 240 hour(s))  Resp panel by RT-PCR (RSV, Flu A&B, Covid) Anterior Nasal Swab     Status: Abnormal   Collection Time: 08/05/2023  2:07 PM   Specimen: Anterior Nasal Swab  Result Value Ref Range Status   SARS Coronavirus 2 by RT PCR POSITIVE (A) NEGATIVE Final   Influenza A by PCR NEGATIVE NEGATIVE Final   Influenza B by PCR NEGATIVE NEGATIVE Final    Comment: (NOTE) The Xpert Xpress SARS-CoV-2/FLU/RSV plus assay is intended as an aid in the diagnosis of influenza from Nasopharyngeal swab specimens and should not be used as a sole basis for treatment. Nasal washings and aspirates are unacceptable for Xpert Xpress SARS-CoV-2/FLU/RSV testing.  Fact Sheet for Patients: BloggerCourse.com  Fact Sheet for Healthcare Providers: SeriousBroker.it  This test is not yet approved or cleared by the [place] FDA and has been authorized for detection and/or diagnosis of SARS-CoV-2 by FDA under an Emergency Use Authorization (EUA). This EUA will remain in effect (meaning this test can be used) for the duration of the COVID-19 declaration under Section 564(b)(1)  of the Act, 21 U.S.C. section 360bbb-3(b)(1), unless the authorization is terminated or revoked.     Resp Syncytial Virus by PCR NEGATIVE NEGATIVE Final    Comment: (NOTE) Fact Sheet for Patients: BloggerCourse.com  Fact Sheet for Healthcare Providers: SeriousBroker.it  This test is not yet approved or cleared by the [place] FDA and has been authorized for detection and/or diagnosis of SARS-CoV-2 by FDA under an Emergency Use Authorization (EUA). This EUA will remain in effect (meaning this test can be used) for the duration of the COVID-19 declaration under Section 564(b)(1) of the Act, 21 U.S.C. section 360bbb-3(b)(1), unless the authorization is terminated or revoked.  Performed at [facility], [address] [place], [place] [address]   Urine Culture (for pregnant, neutropenic or urologic patients or patients with an indwelling urinary catheter)     Status: None   Collection Time: 08-05-23  5:53 PM   Specimen: Urine, Clean Catch  Result Value Ref Range Status   Specimen Description URINE, CLEAN CATCH  Final   Special Requests NONE  Final   Culture   Final    NO GROWTH Performed at [facility], [address], [place], [place] [address]    Report Status 07/20/2023 FINAL  Final      Radiology Studies: No results found.  LOS: 4 days    Jacquelin Hawking, MD [facility] 07/22/2023, 1:30 PM   If 7PM-7AM, please contact night-coverage [email or web address]

## 2023-07-22 NOTE — [place] FL2 (Signed)
[place] MEDICAID FL2 LEVEL OF CARE FORM     IDENTIFICATION  Patient Name: Suzanne Miller Birthdate: Mar 16, 1930 Sex: female Admission Date (Current Location): Jul 31, 2023  [facility] and [place] Number:  Producer, television/film/video and Address:  [facility], [address] [place], [place] [address]      Provider Number: [id number]  Attending Physician Name and Address:  Narda Bonds, MD  Relative Name and Phone Number:       Current Level of Care: Hospital Recommended Level of Care: Skilled Nursing Facility Prior Approval Number:    Date Approved/Denied:   PASRR Number: [id number]  Discharge Plan: SNF    Current Diagnoses: Patient Active Problem List   Diagnosis Date Noted   Deaf 07/19/2023   Anemia due to chronic kidney disease 07/19/2023   Atrial fibrillation with rapid ventricular response (HCC) 2023/07/31   Acute cystitis 07/31/2023   COVID-19 virus infection 07-31-2023   Acute kidney injury superimposed on chronic kidney disease stage IV (HCC) 31-Jul-2023   Chronic heart failure with preserved ejection fraction (HFpEF) (HCC) July 31, 2023   Hypokalemia 31-Jul-2023   Essential hypertension 03/24/2018   Hyperlipidemia    CAD (coronary artery disease), native coronary artery    Anxiety 12/17/2013    Orientation RESPIRATION BLADDER Height & Weight     Self  O2 (2 liters) Incontinent, External catheter Weight: 123 lb 3.8 oz (55.9 kg) Height:  4\' 11"  (149.9 cm)  BEHAVIORAL SYMPTOMS/MOOD NEUROLOGICAL BOWEL NUTRITION STATUS      Continent    AMBULATORY STATUS COMMUNICATION OF NEEDS Skin   Extensive Assist Verbally Normal                       Personal Care Assistance Level of Assistance  Bathing, Feeding, Dressing Bathing Assistance: Maximum assistance Feeding assistance: Limited assistance Dressing Assistance: Maximum assistance     Functional Limitations Info  Sight, Hearing, Speech Sight Info: Impaired Hearing Info:  Impaired Speech Info: Adequate    SPECIAL CARE FACTORS FREQUENCY  PT (By licensed PT), OT (By licensed OT)     PT Frequency: 5xweek OT Frequency: 5xweek            Contractures Contractures Info: Not present    Additional Factors Info  Code Status Code Status Info: Full code             Current Medications (07/22/2023):  This is the current hospital active medication list Current Facility-Administered Medications  Medication Dose Route Frequency Provider Last Rate Last Admin   0.9 %  sodium chloride infusion   Intravenous Continuous Danford, Earl Lites, MD 100 mL/hr at 07/22/23 0327 New Bag at 07/22/23 0327   acetaminophen (TYLENOL) tablet 650 mg  650 mg Oral Q6H PRN Clydie Braun, MD       Or   acetaminophen (TYLENOL) suppository 650 mg  650 mg Rectal Q6H PRN Madelyn Flavors A, MD       albuterol (PROVENTIL) (2.5 MG/3ML) 0.083% nebulizer solution 2.5 mg  2.5 mg Nebulization Q6H PRN Madelyn Flavors A, MD       aspirin EC tablet 81 mg  81 mg Oral Daily Laverda Page B, NP   81 mg at 07/21/23 1017   cefTRIAXone (ROCEPHIN) 1 g in sodium chloride 0.9 % 100 mL IVPB  1 g Intravenous Q24H Smith, Rondell A, MD 200 mL/hr at 07/21/23 1757 1 g at 07/21/23 1757   diazepam (VALIUM) tablet 2 mg  2 mg Oral Q6H PRN  Clydie Braun, MD       diltiazem (CARDIZEM SR) 12 hr capsule 120 mg  120 mg Oral Q12H Orbie Pyo, MD   120 mg at 07/21/23 1237   diltiazem (CARDIZEM) 125 mg in dextrose 5% 125 mL (1 mg/mL) infusion  5-15 mg/hr Intravenous Continuous Gerhard Munch, MD 12.5 mL/hr at 07/22/23 0557 12.5 mg/hr at 07/22/23 0557   enoxaparin (LOVENOX) injection 30 mg  30 mg Subcutaneous Q24H Danford, Earl Lites, MD       guaiFENesin (MUCINEX) 12 hr tablet 600 mg  600 mg Oral BID Madelyn Flavors A, MD   600 mg at 07/20/23 1958   metoprolol tartrate (LOPRESSOR) tablet 75 mg  75 mg Oral BID Orbie Pyo, MD   75 mg at 07/21/23 1017   multivitamin with minerals tablet 1 tablet  1  tablet Oral Daily Madelyn Flavors A, MD   1 tablet at 07/20/23 1006   pravastatin (PRAVACHOL) tablet 20 mg  20 mg Oral Daily Smith, Rondell A, MD   20 mg at 07/21/23 1017   sodium chloride flush (NS) 0.9 % injection 3 mL  3 mL Intravenous Q12H Madelyn Flavors A, MD   3 mL at 07/21/23 1017     Discharge Medications: Please see discharge summary for a list of discharge medications.  Relevant Imaging Results:  Relevant Lab Results:   Additional Information SSN: [id number]  Oletta Lamas, MSW, [facility] Transitions of Care  Clinical Social Worker I

## 2023-07-22 NOTE — Plan of Care (Signed)

## 2023-07-22 NOTE — TOC Progression Note (Signed)
Transition of Care [facility]) - Progression Note    Patient Details  Name: ZAYNE MAROVICH MRN: [id number] Date of Birth: 02-07-1930  Transition of Care [facility]) CM/SW Contact  Leander Rams, LCSW Phone Number: 07/22/2023, 9:44 AM  Clinical Narrative:    CSW spoke with pt daughter [name] regarding SNF disposition. [name] reports pt has stated she wants to go to [facility]. Daughter has agreed for referral to be sent out to [facility] plus additional facilities.   CSW will complete fl2 and fax out. TOC will continue to follow.    Expected Discharge Plan: Skilled Nursing Facility Barriers to Discharge: Continued Medical Work up  Expected Discharge Plan and Services In-house Referral: NA   Post Acute Care Choice: NA                     DME Agency: NA       HH Arranged: NA           Social Determinants of Health (SDOH) Interventions SDOH Screenings   Food Insecurity: No Food Insecurity (2023/07/22)  Housing: Low Risk  (July 22, 2023)  Transportation Needs: No Transportation Needs (Jul 22, 2023)  Utilities: Not At Risk (July 22, 2023)  Tobacco Use: Medium Risk (22-Jul-2023)    Readmission Risk Interventions     No data to display         Oletta Lamas, MSW, LCSWA, LCASA Transitions of Care  Clinical Social Worker I

## 2023-07-22 NOTE — Progress Notes (Signed)
Patient Name: Suzanne Miller Date of Encounter: 07/22/2023 [facility] Cardiologist: Peter Swaziland, MD   Interval Summary  .    Converted to NSR and dilt gtt weaned off.  Now with recurrent AF RVR  Patient confused, denies symptoms  Vital Signs .    Vitals:   07/22/23 0330 07/22/23 0356 07/22/23 0744 07/22/23 1159  BP: 114/74  (!) 146/55 (!) 129/45  Pulse: 67 68 65 62  Resp: 20 17 (!) 21 17  Temp: (!) 97.5 F (36.4 C)  (!) 97.5 F (36.4 C)   TempSrc: Axillary  Oral   SpO2: 92% 94% 92% 93%  Weight:      Height:        Intake/Output Summary (Last 24 hours) at 07/22/2023 1654 Last data filed at 07/22/2023 1330 Gross per 24 hour  Intake 60 ml  Output 400 ml  Net -340 ml      07/21/2023    4:48 AM 07/20/2023   12:43 AM 07/14/2023    9:14 AM  Last 3 Weights  Weight (lbs) 123 lb 3.8 oz 122 lb 5.7 oz 120 lb  Weight (kg) 55.9 kg 55.5 kg 54.432 kg      Telemetry/ECG    AF>>NSR>>AF with RVR   Physical Exam .   GEN: No acute distress.  Neck: No JVD Cardiac: Irregular rate and rhythm, tachycardic, no murmurs, rubs, or gallops.  Respiratory: Poor air movement bilaterally GI: Soft, nontender, non-distended  MS: No edema  Assessment & Plan .     89 y.o. female with a hx of CAD s/p PCI to RCA 1999, HTN, HLD, CKD II who was seen 07/07/2023 for the evaluation of A fib at the request of Dr Jeraldine Loots.   Atrial fibrillation with rapid ventricular response, new diagnosis:  Restart dilt gtt at 5mg /hr; cont PO diltiazem 240mg  qday.  Not an a/c candidate  AKI on CKD II:  Hold nephrotoxins; Cr mildly improved  COVID:  Per primary team  For questions or updates, please contact [facility] Please consult [email or web address] for contact info under        Signed, Orbie Pyo, MD

## 2023-07-22 NOTE — Progress Notes (Signed)
Physical Therapy Treatment Patient Details Name: Suzanne Miller MRN: [id number] DOB: 1930-05-02 Today's Date: 07/22/2023   History of Present Illness Suzanne Miller is a 89 y.o. F admitted 2023/08/05  who presented due to weakness found to have Afib RVR and renal failure.  Also COVID positive. PMH: CKD IV at least (last baseline 1.7 in 2022), CAD s/p remote PCI, HTN, HLD, deaf, and anxiety    PT Comments  Pt admitted with above diagnosis. Pt was able to get OOB with min assistand mod cues for safety with use of RW. Pt unable to progress ambulation.  Hearing difficulty makes session difficult at times.  Pt currently with functional limitations due to the deficits listed below (see PT Problem List). Pt will benefit from acute skilled PT to increase their independence and safety with mobility to allow discharge.       If plan is discharge home, recommend the following: A lot of help with walking and/or transfers;A lot of help with bathing/dressing/bathroom;Assistance with cooking/housework;Assist for transportation;Help with stairs or ramp for entrance;Supervision due to cognitive status   Can travel by private vehicle     No  Equipment Recommendations  Other (comment) (TBA)    Recommendations for Other Services       Precautions / Restrictions Precautions Precautions: Fall Precaution Comments: COVID, pt is deaf Restrictions Weight Bearing Restrictions: No     Mobility  Bed Mobility Overal bed mobility: Needs Assistance Bed Mobility: Supine to Sit, Sit to Supine     Supine to sit: Min assist Sit to supine: Mod assist   General bed mobility comments: Needed assist to initiate movement and to slide pt to EOB.    Transfers Overall transfer level: Needs assistance Equipment used: Rolling walker (2 wheels) Transfers: Sit to/from Stand, Bed to chair/wheelchair/BSC Sit to Stand: Min assist   Step pivot transfers: Min assist       General transfer comment: Pt was able to rise to RW with  min assist and needed assist and cues to take pivotal steps to recliner.    Ambulation/Gait                   Stairs             Wheelchair Mobility     Tilt Bed    Modified Rankin (Stroke Patients Only)       Balance Overall balance assessment: Needs assistance Sitting-balance support: No upper extremity supported, Feet supported Sitting balance-Leahy Scale: Fair     Standing balance support: Bilateral upper extremity supported, Reliant on assistive device for balance, During functional activity Standing balance-Leahy Scale: Poor Standing balance comment: relies on RW and UE support                            Cognition Arousal: Alert Behavior During Therapy: Anxious Overall Cognitive Status: History of cognitive impairments - at baseline Area of Impairment: Safety/judgement, Problem solving, Following commands, Orientation                 Orientation Level: Disoriented to, Place, Time, Situation     Following Commands: Follows one step commands with increased time Safety/Judgement: Decreased awareness of safety, Decreased awareness of deficits   Problem Solving: Slow processing, Decreased initiation, Difficulty sequencing, Requires tactile cues (Gestural cues, tactile cues)          Exercises General Exercises - Lower Extremity Long Arc Quad: AROM, Both, 15 reps, Seated    General  Comments General comments (skin integrity, edema, etc.): VSS on RA      Pertinent Vitals/Pain Pain Assessment Pain Assessment: No/denies pain Breathing: normal Negative Vocalization: none Facial Expression: smiling or inexpressive Body Language: relaxed Consolability: no need to console PAINAD Score: 0    Home Living                          Prior Function            PT Goals (current goals can now be found in the care plan section) Acute Rehab PT Goals Patient Stated Goal: unable to assess Progress towards PT goals:  Progressing toward goals    Frequency    Min 1X/week      PT Plan      Co-evaluation              AM-PAC PT "6 Clicks" Mobility   Outcome Measure  Help needed turning from your back to your side while in a flat bed without using bedrails?: A Little Help needed moving from lying on your back to sitting on the side of a flat bed without using bedrails?: A Little Help needed moving to and from a bed to a chair (including a wheelchair)?: A Lot Help needed standing up from a chair using your arms (e.g., wheelchair or bedside chair)?: A Little Help needed to walk in hospital room?: A Little Help needed climbing 3-5 steps with a railing? : Total 6 Click Score: 15    End of Session Equipment Utilized During Treatment: Gait belt Activity Tolerance: Patient limited by fatigue Patient left: with call bell/phone within reach;in chair;with chair alarm set Nurse Communication: Mobility status PT Visit Diagnosis: Unsteadiness on feet (R26.81);Muscle weakness (generalized) (M62.81)     Time: 1610-9604 PT Time Calculation (min) (ACUTE ONLY): 26 min  Charges:    $Therapeutic Exercise: 8-22 mins $Therapeutic Activity: 8-22 mins PT General Charges $$ ACUTE PT VISIT: 1 Visit                     Lilana Blasko M,PT Acute Rehab Services [phone]    Bevelyn Buckles 07/22/2023, 2:13 PM

## 2023-07-23 ENCOUNTER — Inpatient Hospital Stay (HOSPITAL_COMMUNITY): Payer: Medicare Other

## 2023-07-23 DIAGNOSIS — I4891 Unspecified atrial fibrillation: Secondary | ICD-10-CM | POA: Diagnosis not present

## 2023-07-23 DIAGNOSIS — N179 Acute kidney failure, unspecified: Secondary | ICD-10-CM | POA: Diagnosis not present

## 2023-07-23 DIAGNOSIS — U071 COVID-19: Secondary | ICD-10-CM | POA: Diagnosis not present

## 2023-07-23 DIAGNOSIS — N1832 Chronic kidney disease, stage 3b: Secondary | ICD-10-CM | POA: Diagnosis not present

## 2023-07-23 LAB — BASIC METABOLIC PANEL
Anion gap: 9 (ref 5–15)
Anion gap: 10 (ref 5–15)
BUN: 50 mg/dL — ABNORMAL HIGH (ref 8–23)
BUN: 47 mg/dL — ABNORMAL HIGH (ref 8–23)
CO2: 18 mmol/L — ABNORMAL LOW (ref 22–32)
CO2: 20 mmol/L — ABNORMAL LOW (ref 22–32)
Calcium: 8.5 mg/dL — ABNORMAL LOW (ref 8.9–10.3)
Calcium: 8.5 mg/dL — ABNORMAL LOW (ref 8.9–10.3)
Chloride: 117 mmol/L — ABNORMAL HIGH (ref 98–111)
Chloride: 114 mmol/L — ABNORMAL HIGH (ref 98–111)
Creatinine, Ser: 2.81 mg/dL — ABNORMAL HIGH (ref 0.44–1.00)
Creatinine, Ser: 2.89 mg/dL — ABNORMAL HIGH (ref 0.44–1.00)
GFR, Estimated: 15 mL/min — ABNORMAL LOW (ref >60)
GFR, Estimated: 15 mL/min — ABNORMAL LOW (ref >60)
Glucose, Bld: 184 mg/dL — ABNORMAL HIGH (ref 70–99)
Glucose, Bld: 131 mg/dL — ABNORMAL HIGH (ref 70–99)
Potassium: 3 mmol/L — ABNORMAL LOW (ref 3.5–5.1)
Potassium: 4.1 mmol/L (ref 3.5–5.1)
Sodium: 144 mmol/L (ref 135–145)
Sodium: 144 mmol/L (ref 135–145)

## 2023-07-23 LAB — CBC
HCT: 26.8 % — ABNORMAL LOW (ref 36.0–46.0)
Hemoglobin: 8.6 g/dL — ABNORMAL LOW (ref 12.0–15.0)
MCH: 29.6 pg (ref 26.0–34.0)
MCHC: 32.1 g/dL (ref 30.0–36.0)
MCV: 92.1 fL (ref 80.0–100.0)
Platelets: 175 10*3/uL (ref 150–400)
RBC: 2.91 MIL/uL — ABNORMAL LOW (ref 3.87–5.11)
RDW: 13.2 % (ref 11.5–15.5)
WBC: 7.7 10*3/uL (ref 4.0–10.5)
nRBC: 0 % (ref 0.0–0.2)

## 2023-07-23 LAB — URINALYSIS, W/ REFLEX TO CULTURE (INFECTION SUSPECTED)
Bilirubin Urine: NEGATIVE
Glucose, UA: 50 mg/dL — AB
Ketones, ur: NEGATIVE mg/dL
Leukocytes,Ua: NEGATIVE
Nitrite: NEGATIVE
Protein, ur: 30 mg/dL — AB
Specific Gravity, Urine: 1.013 (ref 1.005–1.030)
pH: 6 (ref 5.0–8.0)

## 2023-07-23 LAB — PROCALCITONIN: Procalcitonin: 0.1 ng/mL

## 2023-07-23 LAB — CORTISOL: Cortisol, Plasma: 51.3 ug/dL

## 2023-07-23 LAB — MAGNESIUM: Magnesium: 2 mg/dL (ref 1.7–2.4)

## 2023-07-23 MED ORDER — POTASSIUM CHLORIDE 10 MEQ/100ML IV SOLN
10.0000 meq | INTRAVENOUS | Status: AC
  Administered 2023-07-23 (×4): 10 meq via INTRAVENOUS
  Filled 2023-07-23 (×4): qty 100

## 2023-07-23 MED ORDER — POTASSIUM CHLORIDE 10 MEQ/100ML IV SOLN
10.0000 meq | Freq: Once | INTRAVENOUS | Status: AC
  Administered 2023-07-23: 10 meq via INTRAVENOUS

## 2023-07-23 MED ORDER — POTASSIUM CHLORIDE 10 MEQ/100ML IV SOLN
10.0000 meq | INTRAVENOUS | Status: AC
  Administered 2023-07-23 (×2): 10 meq via INTRAVENOUS
  Filled 2023-07-23 (×2): qty 100

## 2023-07-23 MED ORDER — FUROSEMIDE 10 MG/ML IJ SOLN
40.0000 mg | Freq: Once | INTRAMUSCULAR | Status: AC
  Administered 2023-07-23: 40 mg via INTRAVENOUS
  Filled 2023-07-23: qty 4

## 2023-07-23 NOTE — Progress Notes (Addendum)
PROGRESS NOTE    [name]  EXB:[id number] DOB: Sep 28, 1930 DOA: July 23, 2023 PCP: Suzanne Dills, MD   Brief Narrative: Suzanne Miller is a 89 y.o. female with a history of CKD stage III, CAD status post PCI, hypertension, hyperlipidemia, anxiety.  Patient presented secondary to elevated heart rate was found to have evidence of atrial fibrillation with RVR and rates to the 200s.  Patient was started on Cardizem drip with improvement of heart rates.  Cardiologist consulted for management.  During admission, patient was found to be positive for COVID-19.  No treatment started.  Patient was also found to have an AKI likely related to hypoperfusion in setting of atrial fibrillation with RVR and dehydration.   Assessment and Plan:  AKI on CKD stage IIIb Most likely CKD stage IIIb based off of PCP note from 2023.  Per cardiology notes from 2023, creatinine as high as 1.78.  Creatinine of 3.74 on admission.  Presumed secondary to poor oral intake in setting of COVID-19 infection in addition to atrial fibrillation and possible cardiogenic etiology.  Patient managed with IV fluids with some improvement in renal function.  Creatinine slightly worsened to 2.81. Renal ultrasound ordered today which was significant for no evidence of hydronephrosis. -Continue IV fluids (switched to sodium bicarb)  Atrial fibrillation with RVR Cardiology consulted.  Patient started on Cardizem drip with improvement of heart rates.  Echocardiogram obtained and was significant for evidence of normal LVEF of 65 to 70%, no aortic valve stenosis; left and right atria not well-visualized. Diltiazem IV restarted. -Cardiology recommendations: Diltiazem PO/IV, metoprolol PRN  Hypothermia Unclear etiology. No overt evidence of infection. Temperature down to 94.2 F with application of a heating blanket overnight. Blood cultures obtained as well. Hypothermia is resolved. -Check urinalysis/culture -Check procalcitonin -Check  chest x-ray  Addendum: Pleural effusion Noted on Chest x-ray imaging. -Stop IV fluids -Lasix 40 mg IV x1 and likely re-dose as needed -US thoracentesis w/labs  Acute respiratory failure with hypoxia Unclear etiology. -Check chest x-ray  Chronic heart failure with preserved ejection fraction Stable.  Acute cystitis Patient started on treatment.  Urine culture obtained is significant for no growth.  Patient does have leukocytosis which is improved. Completed Ceftriaxone IV x5 days.  Metabolic acidosis Secondary to renal failure. Improved with sodium bicarb IV -Sodium bicarb IV fluids -BMP in AM  Abnormal renal ultrasound Bilateral renal lesions seen in addition to an 8x8x15 cm multicystic mass in the anterior abdomen with recommendation for CT abdomen/pelvis with contrast; cannot obtain with IV contrast at this time secondary to renal function. -CT abdomen/pelvis w/contrast once//if renal function improved, otherwise will get a non-contrast CT test  Leukocytosis Unclear etiology but possibly related to presumed cystitis.  White blood cell count as high as 27,600. Now resolved.  COVID-19 infection Patient is COVID-positive.  No evidence of acute illness.  No treatment initiated.  Anemia of chronic disease Related to chronic kidney disease.  [place] of 12.1 on admission which was related to concentration dehydration hemoglobin dropped to 10.5 on 210.  No evidence of acute hemorrhage. Stable.  Thrombocytopenia Mild.  Likely related to acute illness. Resolved.  Moderate tricuspid valve regurgitation Noted on echocardiogram.  Hypokalemia -Potassium supplementation  Primary hypertension -Continue diltiazem  CAD -Continue pravastatin and aspirin  Hyperlipidemia -Continue pravastatin  Anxiety -Continue Valium as needed  Pressure injury Sacrum.  Present on admission.    DVT prophylaxis: Lovenox Code Status:   Code Status: Full Code Family Communication: None at  bedside Disposition Plan:  Discharge to SNF pending ongoing cardiology recommendations and improvement of renal function   Consultants:  Cardiology  Procedures:  Echocardiogram  Antimicrobials: Ceftriaxone   Subjective: Patient states she does not feel well but cannot tell me why.  Objective: BP (!) 141/56 (BP Location: Left Arm)   Pulse 86   Temp 97.9 F (36.6 C) (Axillary)   Resp (!) 23   Ht 4\' 11"  (1.499 m)   Wt 55.9 kg   SpO2 100%   BMI 24.89 kg/m   Examination:  General exam: Appears anxious and uncomfortable. Somewhat ill appearing. Respiratory system: Clear to auscultation. Respiratory effort normal. Cardiovascular system: S1 & S2 heard, irregular rhythm with fast rate Gastrointestinal system: Abdomen is nondistended, soft and nontender. Normal bowel sounds heard. Central nervous system: Alert and oriented to person and place.  Psychiatry: Judgement and insight appear normal. Mood & affect appropriate.    Data Reviewed: I have personally reviewed following labs and imaging studies  CBC Lab Results  Component Value Date   WBC 7.7 07/23/2023   RBC 2.91 (L) 07/23/2023   HGB 8.6 (L) 07/23/2023   HCT 26.8 (L) 07/23/2023   MCV 92.1 07/23/2023   MCH 29.6 07/23/2023   PLT 175 07/23/2023   MCHC 32.1 07/23/2023   RDW 13.2 07/23/2023     Last metabolic panel Lab Results  Component Value Date   NA 144 07/23/2023   K 3.0 (L) 07/23/2023   CL 117 (H) 07/23/2023   CO2 18 (L) 07/23/2023   BUN 50 (H) 07/23/2023   CREATININE 2.81 (H) 07/23/2023   GLUCOSE 184 (H) 07/23/2023   GFRNONAA 15 (L) 07/23/2023   GFRAA 43 (L) 07/07/2016   CALCIUM 8.5 (L) 07/23/2023   PROT 6.9 04/22/2015   ALBUMIN 4.2 04/22/2015   BILITOT 0.5 04/22/2015   ALKPHOS 75 04/22/2015   AST 25 04/22/2015   ALT 24 04/22/2015   ANIONGAP 9 07/23/2023    GFR: Estimated Creatinine Clearance: 10.1 mL/min (A) (by C-G formula based on SCr of 2.81 mg/dL (H)).  Recent Results (from the past 240  hour(s))  Resp panel by RT-PCR (RSV, Flu A&B, Covid) Anterior Nasal Swab     Status: Abnormal   Collection Time: Aug 07, 2023  2:07 PM   Specimen: Anterior Nasal Swab  Result Value Ref Range Status   SARS Coronavirus 2 by RT PCR POSITIVE (A) NEGATIVE Final   Influenza A by PCR NEGATIVE NEGATIVE Final   Influenza B by PCR NEGATIVE NEGATIVE Final    Comment: (NOTE) The Xpert Xpress SARS-CoV-2/FLU/RSV plus assay is intended as an aid in the diagnosis of influenza from Nasopharyngeal swab specimens and should not be used as a sole basis for treatment. Nasal washings and aspirates are unacceptable for Xpert Xpress SARS-CoV-2/FLU/RSV testing.  Fact Sheet for Patients: BloggerCourse.com  Fact Sheet for Healthcare Providers: SeriousBroker.it  This test is not yet approved or cleared by the [place] FDA and has been authorized for detection and/or diagnosis of SARS-CoV-2 by FDA under an Emergency Use Authorization (EUA). This EUA will remain in effect (meaning this test can be used) for the duration of the COVID-19 declaration under Section 564(b)(1) of the Act, 21 U.S.C. section 360bbb-3(b)(1), unless the authorization is terminated or revoked.     Resp Syncytial Virus by PCR NEGATIVE NEGATIVE Final    Comment: (NOTE) Fact Sheet for Patients: BloggerCourse.com  Fact Sheet for Healthcare Providers: SeriousBroker.it  This test is not yet approved or cleared by the [place] and has been authorized  for detection and/or diagnosis of SARS-CoV-2 by FDA under an Emergency Use Authorization (EUA). This EUA will remain in effect (meaning this test can be used) for the duration of the COVID-19 declaration under Section 564(b)(1) of the Act, 21 U.S.C. section 360bbb-3(b)(1), unless the authorization is terminated or revoked.  Performed at [facility], [address]  [place], [place] [address]   Urine Culture (for pregnant, neutropenic or urologic patients or patients with an indwelling urinary catheter)     Status: None   Collection Time: 07/22/2023  5:53 PM   Specimen: Urine, Clean Catch  Result Value Ref Range Status   Specimen Description URINE, CLEAN CATCH  Final   Special Requests NONE  Final   Culture   Final    NO GROWTH Performed at [facility], [address], [place], [place] [address]    Report Status 07/20/2023 FINAL  Final  Culture, blood (Routine X 2) w Reflex to ID Panel     Status: None (Preliminary result)   Collection Time: 07/23/23  3:14 AM   Specimen: BLOOD RIGHT ARM  Result Value Ref Range Status   Specimen Description BLOOD RIGHT ARM  Final   Special Requests   Final    BOTTLES DRAWN AEROBIC AND ANAEROBIC Blood Culture adequate volume   Culture   Final    NO GROWTH < 12 HOURS Performed at [facility], [address], [place], [place] [address]    Report Status PENDING  Incomplete  Culture, blood (Routine X 2) w Reflex to ID Panel     Status: None (Preliminary result)   Collection Time: 07/23/23  3:14 AM   Specimen: BLOOD  Result Value Ref Range Status   Specimen Description BLOOD LEFT ANTECUBITAL  Final   Special Requests   Final    BOTTLES DRAWN AEROBIC AND ANAEROBIC Blood Culture adequate volume   Culture   Final    NO GROWTH < 12 HOURS Performed at [facility], [address], [place], [place] [address]    Report Status PENDING  Incomplete      Radiology Studies: No results found.    LOS: 5 days    Jacquelin Hawking, MD [facility] 07/23/2023, 11:01 AM   If 7PM-7AM, please contact night-coverage [email or web address]

## 2023-07-23 NOTE — Plan of Care (Signed)
Suzanne Miller

## 2023-07-23 NOTE — Progress Notes (Signed)
0009: Paged on-call provider regarding rectal temp of 94.2 and Red MEWS. Requested order for bair hugger.  4098: Rectal temp up to 96. Yellow MEWS. Received orders from on-call provider.   1191: Patient's temp up to 97.8. Turned off [place]. Cardizem gtt continued at 15 mL/hr. 3L [place] remains in place.

## 2023-07-23 NOTE — Progress Notes (Signed)
Patient hypothermic tonight with rectal temperature 94.2 F and placed on Bair hugger.  She is on Cardizem drip for A-fib and heart rate currently 80-90.  Respiratory rate 20 and satting 96% on 3 L nasal cannula.  Blood pressure 112/57.    She was treated for cystitis but urine culture showing no growth.  COVID-positive but imaging was not suggestive of pneumonia.  Leukocytosis had resolved.  TSH was normal.    I have ordered blood cultures and repeat CBC.  Also check cortisol level.  Continue Bair hugger and monitor temperature closely.

## 2023-07-23 NOTE — Plan of Care (Signed)
Rates are controlled  on dilt gtt and BP is stable. Recommend continuing her gtt while being managed with supportive care for COVID. Can continue on oral cardizem 240 mg daily as well. If rates increase, can order metop 5 mg IV q15 min x 3. Otherwise can wean the dilt gtt off closer to discharge. We can follow peripherally.

## 2023-07-24 ENCOUNTER — Inpatient Hospital Stay (HOSPITAL_COMMUNITY): Payer: Medicare Other

## 2023-07-24 DIAGNOSIS — J9601 Acute respiratory failure with hypoxia: Secondary | ICD-10-CM

## 2023-07-24 DIAGNOSIS — U071 COVID-19: Secondary | ICD-10-CM | POA: Diagnosis not present

## 2023-07-24 DIAGNOSIS — Z7189 Other specified counseling: Secondary | ICD-10-CM

## 2023-07-24 DIAGNOSIS — L8990 Pressure ulcer of unspecified site, unspecified stage: Secondary | ICD-10-CM | POA: Insufficient documentation

## 2023-07-24 DIAGNOSIS — Z515 Encounter for palliative care: Secondary | ICD-10-CM

## 2023-07-24 DIAGNOSIS — N179 Acute kidney failure, unspecified: Secondary | ICD-10-CM | POA: Diagnosis not present

## 2023-07-24 DIAGNOSIS — I4891 Unspecified atrial fibrillation: Secondary | ICD-10-CM | POA: Diagnosis not present

## 2023-07-24 LAB — CBC
HCT: 32.3 % — ABNORMAL LOW (ref 36.0–46.0)
Hemoglobin: 10.1 g/dL — ABNORMAL LOW (ref 12.0–15.0)
MCH: 30.1 pg (ref 26.0–34.0)
MCHC: 31.3 g/dL (ref 30.0–36.0)
MCV: 96.4 fL (ref 80.0–100.0)
Platelets: 211 10*3/uL (ref 150–400)
RBC: 3.35 MIL/uL — ABNORMAL LOW (ref 3.87–5.11)
RDW: 13.4 % (ref 11.5–15.5)
WBC: 10.6 10*3/uL — ABNORMAL HIGH (ref 4.0–10.5)
nRBC: 0 % (ref 0.0–0.2)

## 2023-07-24 LAB — BLOOD GAS, ARTERIAL
Acid-base deficit: 6.2 mmol/L — ABNORMAL HIGH (ref 0.0–2.0)
Bicarbonate: 20.7 mmol/L (ref 20.0–28.0)
Drawn by: 65698
O2 Saturation: 99.8 %
Patient temperature: 36.3
pCO2 arterial: 44 mm[Hg] (ref 32–48)
pH, Arterial: 7.28 — ABNORMAL LOW (ref 7.35–7.45)
pO2, Arterial: 164 mm[Hg] — ABNORMAL HIGH (ref 83–108)

## 2023-07-24 LAB — BASIC METABOLIC PANEL
Anion gap: 10 (ref 5–15)
BUN: 47 mg/dL — ABNORMAL HIGH (ref 8–23)
CO2: 21 mmol/L — ABNORMAL LOW (ref 22–32)
Calcium: 8.6 mg/dL — ABNORMAL LOW (ref 8.9–10.3)
Chloride: 112 mmol/L — ABNORMAL HIGH (ref 98–111)
Creatinine, Ser: 3.11 mg/dL — ABNORMAL HIGH (ref 0.44–1.00)
GFR, Estimated: 14 mL/min — ABNORMAL LOW (ref >60)
Glucose, Bld: 133 mg/dL — ABNORMAL HIGH (ref 70–99)
Potassium: 4 mmol/L (ref 3.5–5.1)
Sodium: 143 mmol/L (ref 135–145)

## 2023-07-24 MED ORDER — GLYCOPYRROLATE 0.2 MG/ML IJ SOLN: 0.2000 mg | INTRAMUSCULAR | Status: DC | PRN

## 2023-07-24 MED ORDER — HYDROMORPHONE HCL 1 MG/ML IJ SOLN: 0.5000 mg | INTRAMUSCULAR | Status: DC | PRN

## 2023-07-24 MED ORDER — MORPHINE 100MG IN NS 100ML (1MG/ML) PREMIX INFUSION
0.0000 mg/h | INTRAVENOUS | Status: DC
  Administered 2023-07-24: 5 mg/h via INTRAVENOUS
  Administered 2023-07-25: 20 mg/h via INTRAVENOUS
  Administered 2023-07-25: 5 mg/h via INTRAVENOUS
  Administered 2023-07-25: 20 mg/h via INTRAVENOUS
  Administered 2023-07-25: 5 mg/h via INTRAVENOUS
  Administered 2023-07-26: 20 mg/h via INTRAVENOUS
  Filled 2023-07-24 (×6): qty 100

## 2023-07-24 MED ORDER — MIDAZOLAM HCL 2 MG/2ML IJ SOLN: 2.0000 mg | INTRAMUSCULAR | Status: DC | PRN

## 2023-07-24 MED ORDER — ACETAMINOPHEN 325 MG PO TABS: 650.0000 mg | ORAL_TABLET | Freq: Four times a day (QID) | ORAL | Status: DC | PRN

## 2023-07-24 MED ORDER — MORPHINE BOLUS VIA INFUSION
5.0000 mg | INTRAVENOUS | Status: DC | PRN
  Administered 2023-07-24 – 2023-07-25 (×4): 5 mg via INTRAVENOUS

## 2023-07-24 MED ORDER — FUROSEMIDE 10 MG/ML IJ SOLN
80.0000 mg | Freq: Two times a day (BID) | INTRAMUSCULAR | Status: DC
  Administered 2023-07-24 (×2): 80 mg via INTRAVENOUS
  Filled 2023-07-24 (×2): qty 8

## 2023-07-24 MED ORDER — POLYVINYL ALCOHOL 1.4 % OP SOLN: 1.0000 [drp] | Freq: Four times a day (QID) | OPHTHALMIC | Status: DC | PRN

## 2023-07-24 MED ORDER — FUROSEMIDE 10 MG/ML IJ SOLN
40.0000 mg | Freq: Once | INTRAMUSCULAR | Status: AC
  Administered 2023-07-24: 40 mg via INTRAVENOUS
  Filled 2023-07-24: qty 4

## 2023-07-24 MED ORDER — SODIUM CHLORIDE 0.9 % IV SOLN: INTRAVENOUS | Status: DC

## 2023-07-24 MED ORDER — ACETAMINOPHEN 650 MG RE SUPP: 650.0000 mg | Freq: Four times a day (QID) | RECTAL | Status: DC | PRN

## 2023-07-24 MED ORDER — GLYCOPYRROLATE 1 MG PO TABS: 1.0000 mg | ORAL_TABLET | ORAL | Status: DC | PRN

## 2023-07-24 NOTE — Plan of Care (Signed)

## 2023-07-24 NOTE — Progress Notes (Signed)
Called to bedside for desats into 70s. RN placed pt on NRBM. O2 Sats improved to low 90s HR afib rates 122-160 RR 32 and labored. Attempted BiPAP on Resmed. Although aeration improved and resps less labored, unable to provide adequate O2. Pt placed on V60 @100 % with increased EPAP; noticeable improvement in Sats to 97-100%. IPAP increased due to shallow efforts>low Vts. Pt respiratory status stabilized and ABG drawn.

## 2023-07-24 NOTE — Consult Note (Signed)
NAME:  Suzanne Miller, MRN:  [id number], DOB:  May 10, 1930, LOS: 6 ADMISSION DATE:  06/28/2023, CONSULTATION DATE: 07/24/2023 REFERRING MD: Jodean Lima, CHIEF COMPLAINT: Acute respiratory failure  History of Present Illness:  89 year old female with coronary artery disease, hypertension, hyperlipidemia and CKD stage IIIb who was admitted under hospitalist service for COVID-19 pneumonia, course was complicated with new diagnosis of A-fib with RVR with heart rate ranging into 200, she was started on Cardizem infusion, over the course of few days she continued to have worsening AKI and worsening respiratory failure, currently on BiPAP, struggling to breathe and getting hypoxic with FiO2 80%.  PCCM was consulted for help evaluation medical management During my evaluation patient is in A-fib with heart rate in 150s on Cardizem infusion, she is tachypneic with increased work of breathing on BiPAP, found to be severely encephalopathic  Pertinent  Medical History   Past Medical History:  Diagnosis Date   Anxiety    intermittent   ASCVD (arteriosclerotic cardiovascular disease)    singel vessel s/p PCI of RCA   CAD (coronary artery disease), native coronary artery    s/p PCI of RCA   Hyperlipidemia    Hypertension    Positional vertigo    chronic   Statin intolerance    most statins and zetia     Significant Hospital Events: Including procedures, antibiotic start and stop dates in addition to other pertinent events     Interim History / Subjective:  As above  Objective   Blood pressure (!) 94/55, pulse (!) 112, temperature 97.9 F (36.6 C), temperature source Axillary, resp. rate (!) 24, height 4\' 11"  (1.499 m), weight 61.1 kg, SpO2 100%.    FiO2 (%):  [80 %-100 %] 80 % Pressure Support:  [4 cmH20] 4 cmH20   Intake/Output Summary (Last 24 hours) at 07/24/2023 0907 Last data filed at 07/24/2023 0709 Gross per 24 hour  Intake 412.45 ml  Output 200 ml  Net 212.45 ml   Filed Weights    07/20/23 0043 07/21/23 0448 07/24/23 0127  Weight: 55.5 kg 55.9 kg 61.1 kg    Examination: General: Acute on chronically ill-appearing female, lying on the bed on BiPAP HEENT: [place]/AT, eyes anicteric.  Dry mucus membranes Neuro: Awake, mumbling few words, confused and disoriented Chest: Bilateral basal crackles, no wheezes or rhonchi Heart: Irregularly irregular, tachycardic, no murmurs or gallops Abdomen: Soft, nondistended, bowel sounds present Skin: No rash   Resolved Hospital Problem list     Assessment & Plan:  Acute respiratory failure with hypoxia in the setting of COVID-19 pneumonia Bilateral pleural effusions New diagnosis A-fib with RVR AKI on CKD stage IIIb Acute metabolic acidosis Chronic HFpEF Anemia of chronic disease Acute metabolic encephalopathy in the setting of hypoxia  Patient is a struggling on BiPAP, she is tachypneic with increased work of breathing X-ray chest is suggestive of bilateral infiltrate and possible bilateral effusion Her heart rate is not well-controlled despite being on Cardizem infusion Serum creatinine continued to get worse She does have metabolic acidosis  Considering multisystem organ dysfunction, goals of care discussions were carried with the patient's daughter, advised against mechanical ventilator, patient's daughter understood and decided to proceed with comfort care Comfort care orders written, primary team was notified  Best Practice (right click and "Reselect all SmartList Selections" daily)   Per primary team  Labs   CBC: Recent Labs  Lab 07/19/23 0231 07/20/23 1037 07/21/23 0904 07/23/23 0314 07/24/23 0232  WBC 27.6* 13.6* 9.5 7.7 10.6*  HGB  10.5* 10.0* 10.0* 8.6* 10.1*  HCT 31.7* 30.6* 31.4* 26.8* 32.3*  MCV 91.6 93.3 94.6 92.1 96.4  PLT 145* 148* 165 175 211    Basic Metabolic Panel: Recent Labs  Lab 07-20-2023 0924 07/19/23 0231 07/20/23 1037 07/21/23 0904 07/22/23 0549 07/23/23 0314 07/23/23 1802  07/24/23 0232  NA 141   < > 141 144 143 144 144 143  K 2.9*   < > 2.9* 3.1* 3.4* 3.0* 4.1 4.0  CL 101   < > 111 116* 117* 117* 114* 112*  CO2 18*   < > 16* 14* 12* 18* 20* 21*  GLUCOSE 172*   < > 132* 112* 125* 184* 131* 133*  BUN 93*   < > 66* 57* 52* 50* 47* 47*  CREATININE 3.74*   < > 3.10* 2.73* 2.64* 2.81* 2.89* 3.11*  CALCIUM 9.5   < > 8.5* 8.6* 8.4* 8.5* 8.5* 8.6*  MG 2.6*  --  2.2  --   --  2.0  --   --    < > = values in this interval not displayed.   GFR: Estimated Creatinine Clearance: 9.6 mL/min (A) (by C-G formula based on SCr of 3.11 mg/dL (H)). Recent Labs  Lab 07/20/23 1037 07/21/23 0904 07/23/23 0314 07/24/23 0232  PROCALCITON  --   --  0.10  --   WBC 13.6* 9.5 7.7 10.6*    Liver Function Tests: No results for input(s): "AST", "ALT", "ALKPHOS", "BILITOT", "PROT", "ALBUMIN" in the last 168 hours. No results for input(s): "LIPASE", "AMYLASE" in the last 168 hours. No results for input(s): "AMMONIA" in the last 168 hours.  ABG    Component Value Date/Time   PHART 7.28 (L) 07/24/2023 0356   PCO2ART 44 07/24/2023 0356   PO2ART 164 (H) 07/24/2023 0356   HCO3 20.7 07/24/2023 0356   ACIDBASEDEF 6.2 (H) 07/24/2023 0356   O2SAT 99.8 07/24/2023 0356     Coagulation Profile: No results for input(s): "INR", "PROTIME" in the last 168 hours.  Cardiac Enzymes: No results for input(s): "CKTOTAL", "CKMB", "CKMBINDEX", "TROPONINI" in the last 168 hours.  HbA1C: No results found for: "HGBA1C"  CBG: No results for input(s): "GLUCAP" in the last 168 hours.  Review of Systems:   Unable to obtain due to encephalopathy  Past Medical History:  She,  has a past medical history of Anxiety, ASCVD (arteriosclerotic cardiovascular disease), CAD (coronary artery disease), native coronary artery, Hyperlipidemia, Hypertension, Positional vertigo, and Statin intolerance.   Surgical History:   Past Surgical History:  Procedure Laterality Date   broken ankle  2000    CARDIAC CATHETERIZATION  01/17/1998   single vessel, s/p PTCA stent mid RCA    CORONARY ANGIOPLASTY     CORONARY STENT PLACEMENT       Social History:   reports that she has quit smoking. She has never used smokeless tobacco. She reports that she does not drink alcohol and does not use drugs.   Family History:  Her family history includes Arrhythmia in her sister; Diabetes in her brother, brother, mother, and sister; Heart disease in her brother, brother, and sister; Pneumonia in her father.   Allergies Allergies  Allergen Reactions   Atorvastatin Other (See Comments)    Myalgias at high doses   Penicillins     Has patient had a PCN reaction causing immediate rash, facial/tongue/throat swelling, SOB or lightheadedness with hypotension:YES Has patient had a PCN reaction causing severe rash involving mucus membranes or skin necrosis: NO Has patient had a  PCN reaction that required hospitalization NO Has patient had a PCN reaction occurring within the last 10 years:NO If all of the above answers are "NO", then may proceed with Cephalosporin use.   Sulfa Antibiotics     Rash    Ticlid [Ticlopidine]     Rash      Home Medications  Prior to Admission medications   Medication Sig Start Date End Date Taking? Authorizing Provider  amLODipine (NORVASC) 10 MG tablet Take 1 tablet (10 mg total) by mouth daily. 04/06/22  Yes [place], Peter M, MD  aspirin 81 MG tablet Take 81 mg by mouth daily.    Yes [provider]  diazepam (VALIUM) 5 MG tablet Take 0.25 mg by mouth every 6 (six) hours as needed for anxiety.   Yes [provider]  metoprolol tartrate (LOPRESSOR) 50 MG tablet Take 1.5 tablets (75 mg total) by mouth 2 (two) times daily. 04/06/22  Yes [place], Peter M, MD  Multiple Vitamins-Minerals (MULTIVITAMIN PO) Take 1 tablet by mouth daily.    Yes [provider]  pravastatin (PRAVACHOL) 20 MG tablet Take 1 tablet (20 mg total) by mouth daily. 04/06/22  Yes [place],  Peter M, MD  vitamin B-12 (CYANOCOBALAMIN) 500 MCG tablet Take 500 mcg by mouth daily.   Yes [provider]  Vitamin D, Cholecalciferol, 1000 UNITS TABS Take by mouth.   Yes [provider]     Critical care time:      The patient is critically ill due to AKI on CKD stage IIIb/acute respiratory failure with hypoxia/A-fib with RVR.  Critical care was necessary to treat or prevent imminent or life-threatening deterioration.  Critical care was time spent personally by me on the following activities: development of treatment plan with patient and/or surrogate as well as nursing, discussions with consultants, evaluation of patient's response to treatment, examination of patient, obtaining history from patient or surrogate, ordering and performing treatments and interventions, ordering and review of laboratory studies, ordering and review of radiographic studies, pulse oximetry, re-evaluation of patient's condition and participation in multidisciplinary rounds.   During this encounter critical care time was devoted to patient care services described in this note for 38 minutes.     Cheri Fowler, MD [facility] See Amion for pager If no response to pager, please call [phone] until 7pm After 7pm, Please call E-link [phone]

## 2023-07-24 NOTE — Progress Notes (Signed)
Chart reviewed.  Patient see by Palliative care today and family has decided to proceed with comfort care.  Cardiac monitoring, VS and blood draws have been stopped.  Cardiology will sign off.

## 2023-07-24 NOTE — Progress Notes (Signed)
   07/24/23 0322  Vitals  Temp (!) 97.3 F (36.3 C)  Temp Source Axillary  BP 132/74  MAP (mmHg) 87  BP Location Left Arm  BP Method Automatic  Patient Position (if appropriate) Lying  Pulse Rate (!) 33  Pulse Rate Source Monitor  ECG Heart Rate (!) 125  Resp (!) 30  Level of Consciousness  Level of Consciousness Alert  MEWS COLOR  MEWS Score Color Red  Oxygen Therapy  SpO2 100 %  O2 Device Bi-PAP  Patient Activity (if Appropriate) In bed  Pulse Oximetry Type Continuous  MEWS Score  MEWS Temp 0  MEWS Systolic 0  MEWS Pulse 2  MEWS RR 2  MEWS LOC 0  MEWS Score 4  Provider Notification  Provider Name/Title Dr. Loney Loh RN  Date Provider Notified 07/24/23  Time Provider Notified [phone] (Not quite sure)  Method of Notification Page  Notification Reason Change in status  Provider response See new orders  Date of Provider Response 07/24/23  Time of Provider Response 0320 (Not quite sure)  Rapid Response Notification  Name of Rapid Response RN Notified Mindy RN  Date Rapid Response Notified 07/24/23  Time Rapid Response Notified 0325

## 2023-07-24 NOTE — Progress Notes (Signed)
PROGRESS NOTE    Suzanne YACKO  Miller:[id number] DOB: 05/21/1930 DOA: 07/01/2023 PCP: Renford Dills, MD   Brief Narrative: Suzanne Miller is a 89 y.o. female with a history of CKD stage III, CAD status post PCI, hypertension, hyperlipidemia, anxiety.  Patient presented secondary to elevated heart rate was found to have evidence of atrial fibrillation with RVR and rates to the 200s.  Patient was started on Cardizem drip with improvement of heart rates.  Cardiologist consulted for management.  During admission, patient was found to be positive for COVID-19.  No treatment started.  Patient was also found to have an AKI likely related to hypoperfusion in setting of atrial fibrillation with RVR and dehydration.   Assessment and Plan:  AKI on CKD stage IIIb Most likely CKD stage IIIb based off of PCP note from 2023.  Per cardiology notes from 2023, creatinine as high as 1.78.  Creatinine of 3.74 on admission.  Presumed secondary to poor oral intake in setting of COVID-19 infection in addition to atrial fibrillation and possible cardiogenic etiology.  Patient managed with IV fluids with some improvement in renal function.  Creatinine slightly worsened to 2.81. Renal ultrasound ordered today which was significant for no evidence of hydronephrosis. Comfort measures.  Atrial fibrillation with RVR Cardiology consulted.  Patient started on Cardizem drip with improvement of heart rates.  Echocardiogram obtained and was significant for evidence of normal LVEF of 65 to 70%, no aortic valve stenosis; left and right atria not well-visualized. Diltiazem IV restarted. Comfort measures.  Hypothermia Unclear etiology. No overt evidence of infection. Temperature down to 94.2 F with application of a heating blanket overnight. Blood cultures obtained as well. Hypothermia is resolved. Comfort measures.  Bilateral pleural effusion Noted on Chest x-ray imaging. IV fluids stopped and Lasix IV therapy started.  Thoracentesis ordered but deferred secondary to transition to comfort measures.  Acute respiratory failure with hypoxia Chest x-ray significant for bilateral pleural effusions, likely as etiology for hypoxia. Patient with worsening hypoxia requiring BiPAP with continued decompensation. PCCM was consulted for need to intubate, however continued goals of care discussions led to transition to full comfort measures.  Acute on chronic heart failure with preserved ejection fraction Comfort measures.  Acute cystitis Patient started on treatment.  Urine culture obtained is significant for no growth.  Patient does have leukocytosis which is improved. Completed Ceftriaxone IV x5 days.  Metabolic acidosis Secondary to renal failure. Improved with sodium bicarb IV.  Abnormal renal ultrasound Bilateral renal lesions seen in addition to an 8x8x15 cm multicystic mass in the anterior abdomen with recommendation for CT abdomen/pelvis with contrast; cannot obtain with IV contrast at this time secondary to renal function. Patient is now comfort measures.  Leukocytosis Unclear etiology but possibly related to presumed cystitis.  White blood cell count as high as 27,600. Now improved.  COVID-19 infection Patient is COVID-positive.  No evidence of acute illness.  No treatment initiated.  Anemia of chronic disease Related to chronic kidney disease.  [place] of 12.1 on admission which was related to concentration dehydration hemoglobin dropped to 10.5 on 210.  No evidence of acute hemorrhage. Stable.  Thrombocytopenia Mild.  Likely related to acute illness. Resolved.  Moderate tricuspid valve regurgitation Noted on echocardiogram.  Hypokalemia Resolved with potassium supplementation.  Primary hypertension Comfort measures.  CAD Comfort measures.  Hyperlipidemia Comfort measures.  Anxiety Comfort measures.  Pressure injury Sacrum.  Present on admission.    DVT prophylaxis: Lovenox Code  Status:   Code Status:  Do not attempt resuscitation (DNR) - Comfort care Family Communication: None at bedside. Daughter via telephone Disposition Plan: Anticipate in-hospital death   Consultants:  Cardiology PCCM  Procedures:  Echocardiogram  Antimicrobials: Ceftriaxone   Subjective: Does not answer questions appropriately.  Objective: BP (!) 94/55 (BP Location: Left Arm)   Pulse (!) 112   Temp 97.9 F (36.6 C) (Axillary)   Resp (!) 24   Ht 4\' 11"  (1.499 m)   Wt 61.1 kg   SpO2 100%   BMI 27.21 kg/m   Examination:  General exam: Appears critically ill Respiratory system: Diminished. tachypnea. Respiratory effort normal. Cardiovascular system: S1 & S2 heard, RRR. No murmurs, rubs, gallops or clicks. Gastrointestinal system: Abdomen is nondistended, soft and nontender. No organomegaly or masses felt. Normal bowel sounds heard. Central nervous system: Alert but not oriented and not following commands.   Data Reviewed: I have personally reviewed following labs and imaging studies  CBC Lab Results  Component Value Date   WBC 10.6 (H) 07/24/2023   RBC 3.35 (L) 07/24/2023   HGB 10.1 (L) 07/24/2023   HCT 32.3 (L) 07/24/2023   MCV 96.4 07/24/2023   MCH 30.1 07/24/2023   PLT 211 07/24/2023   MCHC 31.3 07/24/2023   RDW 13.4 07/24/2023     Last metabolic panel Lab Results  Component Value Date   NA 143 07/24/2023   K 4.0 07/24/2023   CL 112 (H) 07/24/2023   CO2 21 (L) 07/24/2023   BUN 47 (H) 07/24/2023   CREATININE 3.11 (H) 07/24/2023   GLUCOSE 133 (H) 07/24/2023   GFRNONAA 14 (L) 07/24/2023   GFRAA 43 (L) 07/07/2016   CALCIUM 8.6 (L) 07/24/2023   PROT 6.9 04/22/2015   ALBUMIN 4.2 04/22/2015   BILITOT 0.5 04/22/2015   ALKPHOS 75 04/22/2015   AST 25 04/22/2015   ALT 24 04/22/2015   ANIONGAP 10 07/24/2023    GFR: Estimated Creatinine Clearance: 9.6 mL/min (A) (by C-G formula based on SCr of 3.11 mg/dL (H)).  Recent Results (from the past 240  hour(s))  Resp panel by RT-PCR (RSV, Flu A&B, Covid) Anterior Nasal Swab     Status: Abnormal   Collection Time: 08-11-23  2:07 PM   Specimen: Anterior Nasal Swab  Result Value Ref Range Status   SARS Coronavirus 2 by RT PCR POSITIVE (A) NEGATIVE Final   Influenza A by PCR NEGATIVE NEGATIVE Final   Influenza B by PCR NEGATIVE NEGATIVE Final    Comment: (NOTE) The Xpert Xpress SARS-CoV-2/FLU/RSV plus assay is intended as an aid in the diagnosis of influenza from Nasopharyngeal swab specimens and should not be used as a sole basis for treatment. Nasal washings and aspirates are unacceptable for Xpert Xpress SARS-CoV-2/FLU/RSV testing.  Fact Sheet for Patients: BloggerCourse.com  Fact Sheet for Healthcare Providers: SeriousBroker.it  This test is not yet approved or cleared by the [place] FDA and has been authorized for detection and/or diagnosis of SARS-CoV-2 by FDA under an Emergency Use Authorization (EUA). This EUA will remain in effect (meaning this test can be used) for the duration of the COVID-19 declaration under Section 564(b)(1) of the Act, 21 U.S.C. section 360bbb-3(b)(1), unless the authorization is terminated or revoked.     Resp Syncytial Virus by PCR NEGATIVE NEGATIVE Final    Comment: (NOTE) Fact Sheet for Patients: BloggerCourse.com  Fact Sheet for Healthcare Providers: SeriousBroker.it  This test is not yet approved or cleared by the [place] FDA and has been authorized for detection and/or diagnosis of  SARS-CoV-2 by FDA under an Emergency Use Authorization (EUA). This EUA will remain in effect (meaning this test can be used) for the duration of the COVID-19 declaration under Section 564(b)(1) of the Act, 21 U.S.C. section 360bbb-3(b)(1), unless the authorization is terminated or revoked.  Performed at [facility], [address]  [place], [place] [address]   Urine Culture (for pregnant, neutropenic or urologic patients or patients with an indwelling urinary catheter)     Status: None   Collection Time: 08/15/2023  5:53 PM   Specimen: Urine, Clean Catch  Result Value Ref Range Status   Specimen Description URINE, CLEAN CATCH  Final   Special Requests NONE  Final   Culture   Final    NO GROWTH Performed at [facility], [address] [place], [place] [address]    Report Status 07/20/2023 FINAL  Final  Culture, blood (Routine X 2) w Reflex to ID Panel     Status: None (Preliminary result)   Collection Time: 07/23/23  3:14 AM   Specimen: BLOOD RIGHT ARM  Result Value Ref Range Status   Specimen Description BLOOD RIGHT ARM  Final   Special Requests   Final    BOTTLES DRAWN AEROBIC AND ANAEROBIC Blood Culture adequate volume   Culture   Final    NO GROWTH 1 DAY Performed at [facility], [address] [place], [place] [address]    Report Status PENDING  Incomplete  Culture, blood (Routine X 2) w Reflex to ID Panel     Status: None (Preliminary result)   Collection Time: 07/23/23  3:14 AM   Specimen: BLOOD  Result Value Ref Range Status   Specimen Description BLOOD LEFT ANTECUBITAL  Final   Special Requests   Final    BOTTLES DRAWN AEROBIC AND ANAEROBIC Blood Culture adequate volume   Culture   Final    NO GROWTH 1 DAY Performed at [facility], [address] [place], [place] [address]    Report Status PENDING  Incomplete      Radiology Studies: DG CHEST PORT 1 VIEW  Result Date: 07/24/2023 CLINICAL DATA:  Hypoxia EXAM: PORTABLE CHEST 1 VIEW COMPARISON:  Film from the previous day. FINDINGS: Cardiac shadow is stable. Aortic calcifications are noted. Bilateral effusions are seen and stable. No new focal infiltrate is noted. IMPRESSION: Stable appearance of the chest when compared with the previous day. Electronically Signed   By: Alcide Clever M.D.   On: 07/24/2023 03:52   DG CHEST PORT 1  VIEW  Result Date: 07/23/2023 CLINICAL DATA:  Hypoxia, hypothermia. EXAM: PORTABLE CHEST 1 VIEW COMPARISON:  Chest radiograph dated 15-Aug-2023. FINDINGS: The heart is obscured but appears unchanged. Vascular calcifications are seen in the aortic arch. Moderate to large bilateral layering pleural effusions with associated atelectasis/airspace disease are increased from prior exam. No pneumothorax. Degenerative changes are seen in the spine. IMPRESSION: Moderate to large bilateral layering pleural effusions with associated atelectasis/airspace disease are increased from prior exam. Electronically Signed   By: Romona Curls M.D.   On: 07/23/2023 15:35   US RENAL  Result Date: 07/23/2023 CLINICAL DATA:  Acute kidney injury. EXAM: RENAL / URINARY TRACT ULTRASOUND COMPLETE COMPARISON:  None Available. FINDINGS: Right Kidney: Renal measurements: 9.2 x 4.1 x 4.8 cm = volume: 95 mL. 5.0 cm cystic lesion evident in the lower pole. Additional smaller hypoechoic lesions noted in the upper pole region. Left Kidney: Renal measurements: 10.2 x 4.8 x 5.9 cm =  volume: 149 mL. 4.3 cm hypoechoic lesion in the left kidney is poorly evaluated due to body habitus. Bladder: Not visualized Other: 8 x 8 x 15 cm multicystic mass identified in the anterior abdomen. IMPRESSION: 1. No evidence for hydronephrosis. 2. Bilateral renal lesions, poorly evaluated due to body habitus. CT abdomen and pelvis with contrast recommended to further evaluate. 3. 8 x 8 x 15 cm multicystic mass in the anterior abdomen. This should be further evaluated by CT of the abdomen and pelvis with oral and intravenous contrast material. Electronically Signed   By: Kennith Center M.D.   On: 07/23/2023 12:21      LOS: 6 days    Jacquelin Hawking, MD [facility] 07/24/2023, 10:52 AM   If 7PM-7AM, please contact night-coverage [email or web address]

## 2023-07-24 NOTE — Significant Event (Addendum)
Rapid Response Event Note   Reason for Call :  2nd set of eyes  Pt here with Afib RVR(on cardizem gtt), COVID, and AKI.   Pt developed decreased mental status and hypoxia(70s per RN on 3L [place]). She was placed on NRB then bipap.  Mental improved on bipap  Initial Focused Assessment:  Pt lying in bed with eyes open. She is alert, moaning. Her breathing is tachpneic and labored. She will not follow commands but will nod/shake head to some simple questions. Lungs diminished t/o. Skin warm and dry.  T-97.3, HR-120s, BP-132/74, RR-30, SpO2-100% on 100% bipap  Pt received 40mg  IV lasix at 1844 with only 100cc out since then. Bladder scan done to find 201cc in bladder.   Interventions:  Done PTA RRT: NRB>Bipap EKG-Afib RVR, nonspecific ST and T wave abnormality PCXR-Stable appearance of the chest when compared with the previous day.  ABG Bladder scan-201cc Lasix 40mg  IV x 1  Plan of Care: Mental status and SpO2 have improved with bipap. HR remains 120s-130s, pt is still tachpneic. Give lasix and monitor response. Please call RRT if further assistance needed.  Event Summary:   MD Notified: Dr. Loney Loh Call [phone] Arrival [phone] End [id number]  Terrilyn Saver, RN

## 2023-07-24 NOTE — Consult Note (Signed)
Palliative Medicine Inpatient Consult Note  Consulting Provider:  Narda Bonds, MD   Reason for consult:  Palliative Care Consult Services Palliative Medicine Consult  Reason for Consult? Failure to thrive.   07/24/2023  HPI:  Per intake H&P --> Suzanne Miller is a 89 y.o. female with a history of CKD stage III, CAD status post PCI, hypertension, hyperlipidemia, anxiety. Patient presented secondary to elevated heart rate was found to have evidence of atrial fibrillation with RVR and rates to the 200s. Patient was started on Cardizem drip with improvement of heart rates. Cardiologist consulted for management. During admission, patient was found to be positive for COVID-19. No treatment started. Patient was also found to have an AKI likely related to hypoperfusion in setting of atrial fibrillation with RVR and dehydration.   The PMT was asked to assist with goals of care conversations.   Clinical Assessment/Goals of Care:  *Please note that this is a verbal dictation therefore any spelling or grammatical errors are due to the "Dragon Medical One" system interpretation.  I have reviewed medical records including EPIC notes, labs and imaging, received report from bedside RN, assessed the patient who is lying in bed saying, "please don't hurt me".    I called patients daughter, Suzanne Miller and son, Suzanne Miller to further discuss diagnosis prognosis, GOC, EOL wishes, disposition and options.   I introduced Palliative Medicine as specialized medical care for people living with serious illness. It focuses on providing relief from the symptoms and stress of a serious illness. The goal is to improve quality of life for both the patient and the family.  Medical History Review and Understanding:  A review of Suzanne Miller's past medical history inclusive of her chronic kidney disease, coronary artery disease, hypertension, hyperlipidemia, anxiety, and memory impairment was held.  Social History:  Suzanne Miller  is originally from [place] though she has lived in [place] for a number of years.  She is widowed.  She had 3 children though one of her sons has passed away.  She also recently lost her sister.  She is retired.  She is a woman of faith and practices within [employer]ism.  Functional and Nutritional State:  Preceding hospitalization Suzanne Miller lived in a single-family home and her nephew provided caregiving.  She was able to walk with a walker, bathe, and dress herself.  Her appetite was deteriorating and her family shares that she was starting to not desire eating or drinking.  Advance Directives:  A detailed discussion was had today regarding advanced directives.  Patient has 2 children a son, Suzanne Miller and a daughter Suzanne Miller who help make decisions jointly.  Code Status:  Concepts specific to code status, artifical feeding and hydration, continued IV antibiotics and rehospitalization was had.  The difference between a aggressive medical intervention path  and a palliative comfort care path for this patient at this time was had.   [place] is an established DNAR/DNI.   Discussion:  Suzanne Miller shares that Suzanne Miller has made the decision herself over the past month or so. She has not desired engagement or taken care of herself. Patients family feel fairly certain that she had made the decision on her own accord preceding hospitalization.   We discussed the goals at this time which are to optimized comfort. We talked about transition to comfort measures in house and what that would entail inclusive of medications to control pain, dyspnea, agitation, nausea, itching, and hiccups.    Family has agreed to stopping all uneccessary measures  such as cardiac monitoring, blood draws, needle sticks, and frequent vital signs.   We reviewed that Suzanne Miller has been started on a low dose morphine gtt which will be titrated as needed.   Utilized reflective listening throughout our time together.   Family has  elected to proceed with comfort care.  Decision Maker: Suzanne Miller (Daughter) [phone] (Mobile)   SUMMARY OF RECOMMENDATIONS   DNAR/DNI  Comfort Care  Morphine gtt started for symptoms  Additional comfort medications per [facility]  The PMT will sign off per secure chat with Dr. Caleb Popp given clarity of goals  Anticipate in hospital death  Code Status/Advance Care Planning: DNAR/DNI  Palliative Prophylaxis:  Aspiration, Bowel Regimen, Delirium Protocol, Frequent Pain Assessment, Oral Care, Palliative Wound Care, and Turn Reposition  Additional Recommendations (Limitations, Scope, Preferences): Continue comfort care  Psycho-social/Spiritual:  Desire for further Chaplaincy support: Yes - Christian Additional Recommendations: Education on end of life   Prognosis: Limited hours to days  Discharge Planning: Discharge will be celestial.  Vitals:   07/24/23 0707 07/24/23 0805  BP: (!) 94/55   Pulse: 100 (!) 112  Resp:  (!) 24  Temp: 97.9 F (36.6 C)   SpO2: 100% 100%    Intake/Output Summary (Last 24 hours) at 07/24/2023 1050 Last data filed at 07/24/2023 0709 Gross per 24 hour  Intake 412.45 ml  Output 200 ml  Net 212.45 ml   Last Weight  Most recent update: 07/24/2023  1:33 AM    Weight  61.1 kg (134 lb 11.2 oz)            Gen:  Elderly F, Caucasian in NAD HEENT: Dry mucous membranes CV: Regular rate and rhythm  PULM: On 2LPM [place], breathing is labored ABD: soft/nontender  EXT: No edema  Neuro: Disoriented  PPS: 10%   This conversation/these recommendations were discussed with patient primary care team, Dr. Caleb Popp  Billing based on MDM: High  Problems Addressed: One acute or chronic illness or injury that poses a threat to life or bodily function  Amount and/or Complexity of Data: Category 3:Discussion of management or test interpretation with external physician/other qualified health care professional/appropriate source (not separately  reported)  Risks: Decision regarding hospitalization or escalation of hospital care and Decision not to resuscitate or to de-escalate care because of poor prognosis ______________________________________________________ Lamarr Lulas [place] Palliative Medicine Team Team Cell Phone: [phone] Please utilize secure chat with additional questions, if there is no response within 30 minutes please call the above phone number  Palliative Medicine Team providers are available by phone from 7am to 7pm daily and can be reached through the team cell phone.  Should this patient require assistance outside of these hours, please call the patient's attending physician.

## 2023-07-24 NOTE — Plan of Care (Signed)
  Problem: Pain Managment: Goal: General experience of comfort will improve Outcome: Progressing   Problem: Education: Goal: Knowledge of risk factors and measures for prevention of condition will improve Outcome: Not Progressing   Problem: Respiratory: Goal: Will maintain a patent airway Outcome: Not Progressing Goal: Complications related to the disease process, condition or treatment will be avoided or minimized Outcome: Not Progressing   Problem: Education: Goal: Knowledge of General Education information will improve Description: Including pain rating scale, medication(s)/side effects and non-pharmacologic comfort measures Outcome: Not Progressing   Problem: Clinical Measurements: Goal: Ability to maintain clinical measurements within normal limits will improve Outcome: Not Progressing   Problem: Activity: Goal: Risk for activity intolerance will decrease Outcome: Not Progressing   Problem: Nutrition: Goal: Adequate nutrition will be maintained Outcome: Not Progressing   Problem: Elimination: Goal: Will not experience complications related to bowel motility Outcome: Not Progressing Goal: Will not experience complications related to urinary retention Outcome: Not Progressing

## 2023-07-24 NOTE — IPAL (Signed)
  Interdisciplinary Goals of Care Family Meeting   Date carried out: 07/24/2023  Location of the meeting: Phone conference  Member's involved: Physician, Bedside Registered Nurse, and Family Member or next of kin  Durable Power of Attorney or acting medical decision maker: Ashley Royalty  Discussion: We discussed goals of care for Suzanne Miller .    The Clinical status was relayed to patient's daughter over the phone in detail.   Updated and notified of patients medical condition.   Patient remains confused, restless, is having a weak cough and struggling to remove secretions.   Patient with increased WOB and using accessory muscles to breathe, currently on BiPAP, struggling to breathe Patient's heart rate remained in 150s, also her serum creatinine continue to rise Considering multiple organs are not functioning well, and she is struggling to breathe, I recommend not to proceed with endotracheal intubation as chances are she may not be able to come back from ventilator.   Patient with Progressive multiorgan failure with a very high probablity of a very minimal chance of meaningful recovery despite all aggressive and optimal medical therapy.  Patient's family agreed that putting her on ventilator will not be helpful and decided to continue with comfort care  Code status: Full DNR  Disposition: In-patient comfort care    Family are satisfied with Plan of action and management. All questions answered   Cheri Fowler MD [facility] See Amion for pager If no response to pager, please call [phone] until 7pm After 7pm, Please call E-link [phone]

## 2023-07-24 NOTE — Progress Notes (Addendum)
I came from a  break and received report that the patient was de sating into the 70% on 3 L N/C. She was also tachypnea and  tachycardia  as seen in the flow sheet. Patient was placed on NRB with no improvement. She's now on BIPAP. Notified Dr. Loney Loh and RRT. New orders received.  Will administer and continue to monitor.

## 2023-07-25 DIAGNOSIS — N179 Acute kidney failure, unspecified: Secondary | ICD-10-CM | POA: Diagnosis not present

## 2023-07-25 DIAGNOSIS — I4891 Unspecified atrial fibrillation: Secondary | ICD-10-CM | POA: Diagnosis not present

## 2023-07-25 DIAGNOSIS — N1832 Chronic kidney disease, stage 3b: Secondary | ICD-10-CM | POA: Diagnosis not present

## 2023-07-25 DIAGNOSIS — U071 COVID-19: Secondary | ICD-10-CM | POA: Diagnosis not present

## 2023-07-25 NOTE — Plan of Care (Signed)
  Problem: Respiratory: Goal: Will maintain a patent airway Outcome: Progressing   Problem: Pain Managment: Goal: General experience of comfort will improve Outcome: Progressing   Problem: Safety: Goal: Ability to remain free from injury will improve Outcome: Progressing

## 2023-07-25 NOTE — Progress Notes (Signed)
PROGRESS NOTE    Suzanne Miller  ZOX:[id number] DOB: [birth year] DOA: 07/19/2023 PCP: Renford Dills, MD   Brief Narrative: Suzanne Miller is a 89 y.o. female with a history of CKD stage III, CAD status post PCI, hypertension, hyperlipidemia, anxiety.  Patient presented secondary to elevated heart rate was found to have evidence of atrial fibrillation with RVR and rates to the 200s.  Patient was started on Cardizem drip with improvement of heart rates.  Cardiologist consulted for management.  During admission, patient was found to be positive for COVID-19.  No treatment started.  Patient was also found to have an AKI likely related to hypoperfusion in setting of atrial fibrillation with RVR and dehydration.   Assessment and Plan:  AKI on CKD stage IIIb Most likely CKD stage IIIb based off of PCP note from 2023.  Per cardiology notes from 2023, creatinine as high as 1.78.  Creatinine of 3.74 on admission.  Presumed secondary to poor oral intake in setting of COVID-19 infection in addition to atrial fibrillation and possible cardiogenic etiology.  Patient managed with IV fluids with some improvement in renal function.  Creatinine slightly worsened to 2.81. Renal ultrasound ordered today which was significant for no evidence of hydronephrosis. Comfort measures.  Atrial fibrillation with RVR Cardiology consulted.  Patient started on Cardizem drip with improvement of heart rates.  Echocardiogram obtained and was significant for evidence of normal LVEF of 65 to 70%, no aortic valve stenosis; left and right atria not well-visualized. Diltiazem IV restarted. Comfort measures.  Hypothermia Unclear etiology. No overt evidence of infection. Temperature down to 94.2 F with application of a heating blanket overnight. Blood cultures obtained as well. Hypothermia is resolved. Comfort measures.  Bilateral pleural effusion Noted on Chest x-ray imaging. IV fluids stopped and Lasix IV therapy started.  Thoracentesis ordered but deferred secondary to transition to comfort measures.  Acute respiratory failure with hypoxia Chest x-ray significant for bilateral pleural effusions, likely as etiology for hypoxia. Patient with worsening hypoxia requiring BiPAP with continued decompensation. PCCM was consulted for need to intubate, however continued goals of care discussions led to transition to full comfort measures.  Acute on chronic heart failure with preserved ejection fraction Comfort measures.  Acute cystitis Patient started on treatment.  Urine culture obtained is significant for no growth.  Patient does have leukocytosis which is improved. Completed Ceftriaxone IV x5 days.  Metabolic acidosis Secondary to renal failure. Improved with sodium bicarb IV.  Abnormal renal ultrasound Bilateral renal lesions seen in addition to an 8x8x15 cm multicystic mass in the anterior abdomen with recommendation for CT abdomen/pelvis with contrast; cannot obtain with IV contrast at this time secondary to renal function. Patient is now comfort measures.  Leukocytosis Unclear etiology but possibly related to presumed cystitis.  White blood cell count as high as 27,600. Now improved.  COVID-19 infection Patient is COVID-positive.  No evidence of acute illness.  No treatment initiated.  Anemia of chronic disease Related to chronic kidney disease.  [place] of 12.1 on admission which was related to concentration dehydration hemoglobin dropped to 10.5 on 210.  No evidence of acute hemorrhage. Stable.  Thrombocytopenia Mild.  Likely related to acute illness. Resolved.  Moderate tricuspid valve regurgitation Noted on echocardiogram.  Hypokalemia Resolved with potassium supplementation.  Primary hypertension Comfort measures.  CAD Comfort measures.  Hyperlipidemia Comfort measures.  Anxiety Comfort measures.  Pressure injury Sacrum.  Present on admission.    DVT prophylaxis: Lovenox Code  Status:   Code Status:  Do not attempt resuscitation (DNR) - Comfort care Family Communication: None at bedside. Daughter via telephone Disposition Plan: Anticipate in-hospital death   Consultants:  Cardiology PCCM  Procedures:  Echocardiogram  Antimicrobials: Ceftriaxone   Subjective: No events from overnight.  Objective: BP (!) 130/58 (BP Location: Left Arm)   Pulse (!) 103   Temp 97.6 F (36.4 C) (Axillary)   Resp 13   Ht 4\' 11"  (1.499 m)   Wt 61.1 kg   SpO2 92%   BMI 27.21 kg/m   Examination:  General exam: Appears calm and comfortable   Data Reviewed: I have personally reviewed following labs and imaging studies  CBC Lab Results  Component Value Date   WBC 10.6 (H) 07/24/2023   RBC 3.35 (L) 07/24/2023   HGB 10.1 (L) 07/24/2023   HCT 32.3 (L) 07/24/2023   MCV 96.4 07/24/2023   MCH 30.1 07/24/2023   PLT 211 07/24/2023   MCHC 31.3 07/24/2023   RDW 13.4 07/24/2023     Last metabolic panel Lab Results  Component Value Date   NA 143 07/24/2023   K 4.0 07/24/2023   CL 112 (H) 07/24/2023   CO2 21 (L) 07/24/2023   BUN 47 (H) 07/24/2023   CREATININE 3.11 (H) 07/24/2023   GLUCOSE 133 (H) 07/24/2023   GFRNONAA 14 (L) 07/24/2023   GFRAA 43 (L) 07/07/2016   CALCIUM 8.6 (L) 07/24/2023   PROT 6.9 04/22/2015   ALBUMIN 4.2 04/22/2015   BILITOT 0.5 04/22/2015   ALKPHOS 75 04/22/2015   AST 25 04/22/2015   ALT 24 04/22/2015   ANIONGAP 10 07/24/2023    GFR: Estimated Creatinine Clearance: 9.6 mL/min (A) (by C-G formula based on SCr of 3.11 mg/dL (H)).  Recent Results (from the past 240 hour(s))  Resp panel by RT-PCR (RSV, Flu A&B, Covid) Anterior Nasal Swab     Status: Abnormal   Collection Time: 2023/08/11  2:07 PM   Specimen: Anterior Nasal Swab  Result Value Ref Range Status   SARS Coronavirus 2 by RT PCR POSITIVE (A) NEGATIVE Final   Influenza A by PCR NEGATIVE NEGATIVE Final   Influenza B by PCR NEGATIVE NEGATIVE Final    Comment: (NOTE) The  Xpert Xpress SARS-CoV-2/FLU/RSV plus assay is intended as an aid in the diagnosis of influenza from Nasopharyngeal swab specimens and should not be used as a sole basis for treatment. Nasal washings and aspirates are unacceptable for Xpert Xpress SARS-CoV-2/FLU/RSV testing.  Fact Sheet for Patients: BloggerCourse.com  Fact Sheet for Healthcare Providers: SeriousBroker.it  This test is not yet approved or cleared by the [place] FDA and has been authorized for detection and/or diagnosis of SARS-CoV-2 by FDA under an Emergency Use Authorization (EUA). This EUA will remain in effect (meaning this test can be used) for the duration of the COVID-19 declaration under Section 564(b)(1) of the Act, 21 U.S.C. section 360bbb-3(b)(1), unless the authorization is terminated or revoked.     Resp Syncytial Virus by PCR NEGATIVE NEGATIVE Final    Comment: (NOTE) Fact Sheet for Patients: BloggerCourse.com  Fact Sheet for Healthcare Providers: SeriousBroker.it  This test is not yet approved or cleared by the [place] FDA and has been authorized for detection and/or diagnosis of SARS-CoV-2 by FDA under an Emergency Use Authorization (EUA). This EUA will remain in effect (meaning this test can be used) for the duration of the COVID-19 declaration under Section 564(b)(1) of the Act, 21 U.S.C. section 360bbb-3(b)(1), unless the authorization is terminated or revoked.  Performed at [facility]  [facility], [address], [place], [place] [address]   Urine Culture (for pregnant, neutropenic or urologic patients or patients with an indwelling urinary catheter)     Status: None   Collection Time: 08/13/23  5:53 PM   Specimen: Urine, Clean Catch  Result Value Ref Range Status   Specimen Description URINE, CLEAN CATCH  Final   Special Requests NONE  Final   Culture   Final    NO  GROWTH Performed at [facility], [address] [place], [place] [address]    Report Status 07/20/2023 FINAL  Final  Culture, blood (Routine X 2) w Reflex to ID Panel     Status: None (Preliminary result)   Collection Time: 07/23/23  3:14 AM   Specimen: BLOOD RIGHT ARM  Result Value Ref Range Status   Specimen Description BLOOD RIGHT ARM  Final   Special Requests   Final    BOTTLES DRAWN AEROBIC AND ANAEROBIC Blood Culture adequate volume   Culture   Final    NO GROWTH 2 DAYS Performed at [facility], [address] [place], [place] [address]    Report Status PENDING  Incomplete  Culture, blood (Routine X 2) w Reflex to ID Panel     Status: None (Preliminary result)   Collection Time: 07/23/23  3:14 AM   Specimen: BLOOD  Result Value Ref Range Status   Specimen Description BLOOD LEFT ANTECUBITAL  Final   Special Requests   Final    BOTTLES DRAWN AEROBIC AND ANAEROBIC Blood Culture adequate volume   Culture   Final    NO GROWTH 2 DAYS Performed at [facility], [address] [place], [place] [address]    Report Status PENDING  Incomplete      Radiology Studies: DG CHEST PORT 1 VIEW  Result Date: 07/24/2023 CLINICAL DATA:  Hypoxia EXAM: PORTABLE CHEST 1 VIEW COMPARISON:  Film from the previous day. FINDINGS: Cardiac shadow is stable. Aortic calcifications are noted. Bilateral effusions are seen and stable. No new focal infiltrate is noted. IMPRESSION: Stable appearance of the chest when compared with the previous day. Electronically Signed   By: Alcide Clever M.D.   On: 07/24/2023 03:52      LOS: 7 days    Jacquelin Hawking, MD [facility] 07/25/2023, 12:28 PM   If 7PM-7AM, please contact night-coverage [email or web address]

## 2023-07-25 NOTE — Progress Notes (Signed)
Hoyle Barr RN wasted 10ml of morphine when replacing new bag, witnessed by Jerel Shepherd, RN

## 2023-07-26 DIAGNOSIS — I5033 Acute on chronic diastolic (congestive) heart failure: Secondary | ICD-10-CM | POA: Insufficient documentation

## 2023-07-26 DEATH — deceased

## 2023-07-28 LAB — CULTURE, BLOOD (ROUTINE X 2)
Culture: NO GROWTH
Culture: NO GROWTH
Special Requests: ADEQUATE
Special Requests: ADEQUATE

## 2023-08-26 NOTE — Progress Notes (Signed)
Pt's son stated no funeral home has been chosen yet, and no family member will come to visit tonight. He said go ahead to move pt's body to the morgue.

## 2023-08-26 NOTE — Progress Notes (Addendum)
    OVERNIGHT PROGRESS REPORT  Notified by RN that patient is deceased as of 65 Hrs  Patient was DNR/ CMO (comfort care) followed by Palliative  2 RN verified.  Family was notified but not at bedside.    Suzanne Miller MSNA MSN ACNPC-AG Acute Care Nurse Practitioner [facility]

## 2023-08-26 NOTE — Death Summary Note (Signed)
mitral valve regurgitation. Mild mitral valve stenosis. Tricuspid Valve: The tricuspid valve is normal in structure. Tricuspid valve regurgitation is moderate. Aortic Valve: The aortic valve is calcified. Aortic valve regurgitation is not visualized. Aortic valve sclerosis is present, with no evidence of aortic valve stenosis. Pulmonic Valve: The pulmonic valve was normal in structure. Pulmonic valve regurgitation is not visualized. No evidence of pulmonic stenosis. Aorta: The aortic root and ascending aorta are structurally normal, with no evidence of dilitation. Venous: The inferior vena cava is dilated in  size with less than 50% respiratory variability, suggesting right atrial pressure of 15 mmHg. IAS/Shunts: The interatrial septum was not well visualized.  LEFT VENTRICLE PLAX 2D LVIDd:         3.10 cm LVIDs:         2.00 cm LV PW:         1.10 cm LV IVS:        1.20 cm LVOT diam:     1.90 cm LVOT Area:     2.84 cm  LEFT ATRIUM         Index LA diam:    3.80 cm 2.56 cm/m   AORTA Ao Root diam: 2.60 cm Ao Asc diam:  3.60 cm TRICUSPID VALVE TR Peak grad:   22.7 mmHg TR Vmax:        238.00 cm/s  SHUNTS Systemic Diam: 1.90 cm Riley Lam MD Electronically signed by Riley Lam MD Signature Date/Time: 07/19/2023/4:42:06 PM    Final    DG Chest Port 1 View  Result Date: 07/09/2023 CLINICAL DATA:  New atrial fibrillation. EXAM: PORTABLE CHEST 1 VIEW COMPARISON:  07/07/2016 FINDINGS: Lung volumes are low. Stable heart size, mild cardiomegaly. Aortic atherosclerosis. Stable mediastinal contours. Small bilateral pleural effusions. Associated bibasilar airspace disease. Mild interstitial coarsening which may represent pulmonary edema. Biapical pleuroparenchymal scarring, chronic. No pneumothorax. Advanced left shoulder arthropathy. IMPRESSION: 1. Low lung volumes with mild cardiomegaly. Small bilateral pleural effusions. 2. Mild interstitial coarsening which may represent pulmonary edema. Electronically Signed   By: Narda Rutherford M.D.   On: 07/15/2023 11:13    Microbiology: Recent Results (from the past 240 hour(s))  Resp panel by RT-PCR (RSV, Flu A&B, Covid) Anterior Nasal Swab     Status: Abnormal   Collection Time: 07/04/2023  2:07 PM   Specimen: Anterior Nasal Swab  Result Value Ref Range Status   SARS Coronavirus 2 by RT PCR POSITIVE (A) NEGATIVE Final   Influenza A by PCR NEGATIVE NEGATIVE Final   Influenza B by PCR NEGATIVE NEGATIVE Final    Comment: (NOTE) The Xpert Xpress SARS-CoV-2/FLU/RSV plus assay is intended as an aid in the diagnosis of influenza from Nasopharyngeal swab  specimens and should not be used as a sole basis for treatment. Nasal washings and aspirates are unacceptable for Xpert Xpress SARS-CoV-2/FLU/RSV testing.  Fact Sheet for Patients: BloggerCourse.com  Fact Sheet for Healthcare Providers: SeriousBroker.it  This test is not yet approved or cleared by the [place] FDA and has been authorized for detection and/or diagnosis of SARS-CoV-2 by FDA under an Emergency Use Authorization (EUA). This EUA will remain in effect (meaning this test can be used) for the duration of the COVID-19 declaration under Section 564(b)(1) of the Act, 21 U.S.C. section 360bbb-3(b)(1), unless the authorization is terminated or revoked.     Resp Syncytial Virus by PCR NEGATIVE NEGATIVE Final    Comment: (NOTE) Fact Sheet for Patients: BloggerCourse.com  Fact Sheet for Healthcare Providers: SeriousBroker.it  This test is not  yet approved or cleared by the [place] and has been authorized for detection and/or diagnosis of SARS-CoV-2 by FDA under an Emergency Use Authorization (EUA). This EUA will remain in effect (meaning this test can be used) for the duration of the COVID-19 declaration under Section 564(b)(1) of the Act, 21 U.S.C. section 360bbb-3(b)(1), unless the authorization is terminated or revoked.  Performed at [facility], [address], [place], [place] [address]   Urine Culture (for pregnant, neutropenic or urologic patients or patients with an indwelling urinary catheter)     Status: None   Collection Time: 07/08/2023  5:53 PM   Specimen: Urine, Clean Catch  Result Value Ref Range Status   Specimen Description URINE, CLEAN CATCH  Final   Special Requests NONE  Final   Culture   Final    NO GROWTH Performed at [facility], [address], [place], [place] [address]    Report Status 07/20/2023 FINAL  Final   Culture, blood (Routine X 2) w Reflex to ID Panel     Status: None (Preliminary result)   Collection Time: 07/23/23  3:14 AM   Specimen: BLOOD RIGHT ARM  Result Value Ref Range Status   Specimen Description BLOOD RIGHT ARM  Final   Special Requests   Final    BOTTLES DRAWN AEROBIC AND ANAEROBIC Blood Culture adequate volume   Culture   Final    NO GROWTH 3 DAYS Performed at [facility], [address] [place], [place] [address]    Report Status PENDING  Incomplete  Culture, blood (Routine X 2) w Reflex to ID Panel     Status: None (Preliminary result)   Collection Time: 07/23/23  3:14 AM   Specimen: BLOOD  Result Value Ref Range Status   Specimen Description BLOOD LEFT ANTECUBITAL  Final   Special Requests   Final    BOTTLES DRAWN AEROBIC AND ANAEROBIC Blood Culture adequate volume   Culture   Final    NO GROWTH 3 DAYS Performed at [facility], [address] [place], [place] [address]    Report Status PENDING  Incomplete    Signed: Jacquelin Hawking, MD August 22, 2023  yet approved or cleared by the [place] and has been authorized for detection and/or diagnosis of SARS-CoV-2 by FDA under an Emergency Use Authorization (EUA). This EUA will remain in effect (meaning this test can be used) for the duration of the COVID-19 declaration under Section 564(b)(1) of the Act, 21 U.S.C. section 360bbb-3(b)(1), unless the authorization is terminated or revoked.  Performed at [facility], [address], [place], [place] [address]   Urine Culture (for pregnant, neutropenic or urologic patients or patients with an indwelling urinary catheter)     Status: None   Collection Time: 07/08/2023  5:53 PM   Specimen: Urine, Clean Catch  Result Value Ref Range Status   Specimen Description URINE, CLEAN CATCH  Final   Special Requests NONE  Final   Culture   Final    NO GROWTH Performed at [facility], [address], [place], [place] [address]    Report Status 07/20/2023 FINAL  Final   Culture, blood (Routine X 2) w Reflex to ID Panel     Status: None (Preliminary result)   Collection Time: 07/23/23  3:14 AM   Specimen: BLOOD RIGHT ARM  Result Value Ref Range Status   Specimen Description BLOOD RIGHT ARM  Final   Special Requests   Final    BOTTLES DRAWN AEROBIC AND ANAEROBIC Blood Culture adequate volume   Culture   Final    NO GROWTH 3 DAYS Performed at [facility], [address] [place], [place] [address]    Report Status PENDING  Incomplete  Culture, blood (Routine X 2) w Reflex to ID Panel     Status: None (Preliminary result)   Collection Time: 07/23/23  3:14 AM   Specimen: BLOOD  Result Value Ref Range Status   Specimen Description BLOOD LEFT ANTECUBITAL  Final   Special Requests   Final    BOTTLES DRAWN AEROBIC AND ANAEROBIC Blood Culture adequate volume   Culture   Final    NO GROWTH 3 DAYS Performed at [facility], [address] [place], [place] [address]    Report Status PENDING  Incomplete    Signed: Jacquelin Hawking, MD August 22, 2023  mitral valve regurgitation. Mild mitral valve stenosis. Tricuspid Valve: The tricuspid valve is normal in structure. Tricuspid valve regurgitation is moderate. Aortic Valve: The aortic valve is calcified. Aortic valve regurgitation is not visualized. Aortic valve sclerosis is present, with no evidence of aortic valve stenosis. Pulmonic Valve: The pulmonic valve was normal in structure. Pulmonic valve regurgitation is not visualized. No evidence of pulmonic stenosis. Aorta: The aortic root and ascending aorta are structurally normal, with no evidence of dilitation. Venous: The inferior vena cava is dilated in  size with less than 50% respiratory variability, suggesting right atrial pressure of 15 mmHg. IAS/Shunts: The interatrial septum was not well visualized.  LEFT VENTRICLE PLAX 2D LVIDd:         3.10 cm LVIDs:         2.00 cm LV PW:         1.10 cm LV IVS:        1.20 cm LVOT diam:     1.90 cm LVOT Area:     2.84 cm  LEFT ATRIUM         Index LA diam:    3.80 cm 2.56 cm/m   AORTA Ao Root diam: 2.60 cm Ao Asc diam:  3.60 cm TRICUSPID VALVE TR Peak grad:   22.7 mmHg TR Vmax:        238.00 cm/s  SHUNTS Systemic Diam: 1.90 cm Riley Lam MD Electronically signed by Riley Lam MD Signature Date/Time: 07/19/2023/4:42:06 PM    Final    DG Chest Port 1 View  Result Date: 07/09/2023 CLINICAL DATA:  New atrial fibrillation. EXAM: PORTABLE CHEST 1 VIEW COMPARISON:  07/07/2016 FINDINGS: Lung volumes are low. Stable heart size, mild cardiomegaly. Aortic atherosclerosis. Stable mediastinal contours. Small bilateral pleural effusions. Associated bibasilar airspace disease. Mild interstitial coarsening which may represent pulmonary edema. Biapical pleuroparenchymal scarring, chronic. No pneumothorax. Advanced left shoulder arthropathy. IMPRESSION: 1. Low lung volumes with mild cardiomegaly. Small bilateral pleural effusions. 2. Mild interstitial coarsening which may represent pulmonary edema. Electronically Signed   By: Narda Rutherford M.D.   On: 07/15/2023 11:13    Microbiology: Recent Results (from the past 240 hour(s))  Resp panel by RT-PCR (RSV, Flu A&B, Covid) Anterior Nasal Swab     Status: Abnormal   Collection Time: 07/04/2023  2:07 PM   Specimen: Anterior Nasal Swab  Result Value Ref Range Status   SARS Coronavirus 2 by RT PCR POSITIVE (A) NEGATIVE Final   Influenza A by PCR NEGATIVE NEGATIVE Final   Influenza B by PCR NEGATIVE NEGATIVE Final    Comment: (NOTE) The Xpert Xpress SARS-CoV-2/FLU/RSV plus assay is intended as an aid in the diagnosis of influenza from Nasopharyngeal swab  specimens and should not be used as a sole basis for treatment. Nasal washings and aspirates are unacceptable for Xpert Xpress SARS-CoV-2/FLU/RSV testing.  Fact Sheet for Patients: BloggerCourse.com  Fact Sheet for Healthcare Providers: SeriousBroker.it  This test is not yet approved or cleared by the [place] FDA and has been authorized for detection and/or diagnosis of SARS-CoV-2 by FDA under an Emergency Use Authorization (EUA). This EUA will remain in effect (meaning this test can be used) for the duration of the COVID-19 declaration under Section 564(b)(1) of the Act, 21 U.S.C. section 360bbb-3(b)(1), unless the authorization is terminated or revoked.     Resp Syncytial Virus by PCR NEGATIVE NEGATIVE Final    Comment: (NOTE) Fact Sheet for Patients: BloggerCourse.com  Fact Sheet for Healthcare Providers: SeriousBroker.it  This test is not  mitral valve regurgitation. Mild mitral valve stenosis. Tricuspid Valve: The tricuspid valve is normal in structure. Tricuspid valve regurgitation is moderate. Aortic Valve: The aortic valve is calcified. Aortic valve regurgitation is not visualized. Aortic valve sclerosis is present, with no evidence of aortic valve stenosis. Pulmonic Valve: The pulmonic valve was normal in structure. Pulmonic valve regurgitation is not visualized. No evidence of pulmonic stenosis. Aorta: The aortic root and ascending aorta are structurally normal, with no evidence of dilitation. Venous: The inferior vena cava is dilated in  size with less than 50% respiratory variability, suggesting right atrial pressure of 15 mmHg. IAS/Shunts: The interatrial septum was not well visualized.  LEFT VENTRICLE PLAX 2D LVIDd:         3.10 cm LVIDs:         2.00 cm LV PW:         1.10 cm LV IVS:        1.20 cm LVOT diam:     1.90 cm LVOT Area:     2.84 cm  LEFT ATRIUM         Index LA diam:    3.80 cm 2.56 cm/m   AORTA Ao Root diam: 2.60 cm Ao Asc diam:  3.60 cm TRICUSPID VALVE TR Peak grad:   22.7 mmHg TR Vmax:        238.00 cm/s  SHUNTS Systemic Diam: 1.90 cm Riley Lam MD Electronically signed by Riley Lam MD Signature Date/Time: 07/19/2023/4:42:06 PM    Final    DG Chest Port 1 View  Result Date: 07/09/2023 CLINICAL DATA:  New atrial fibrillation. EXAM: PORTABLE CHEST 1 VIEW COMPARISON:  07/07/2016 FINDINGS: Lung volumes are low. Stable heart size, mild cardiomegaly. Aortic atherosclerosis. Stable mediastinal contours. Small bilateral pleural effusions. Associated bibasilar airspace disease. Mild interstitial coarsening which may represent pulmonary edema. Biapical pleuroparenchymal scarring, chronic. No pneumothorax. Advanced left shoulder arthropathy. IMPRESSION: 1. Low lung volumes with mild cardiomegaly. Small bilateral pleural effusions. 2. Mild interstitial coarsening which may represent pulmonary edema. Electronically Signed   By: Narda Rutherford M.D.   On: 07/15/2023 11:13    Microbiology: Recent Results (from the past 240 hour(s))  Resp panel by RT-PCR (RSV, Flu A&B, Covid) Anterior Nasal Swab     Status: Abnormal   Collection Time: 07/04/2023  2:07 PM   Specimen: Anterior Nasal Swab  Result Value Ref Range Status   SARS Coronavirus 2 by RT PCR POSITIVE (A) NEGATIVE Final   Influenza A by PCR NEGATIVE NEGATIVE Final   Influenza B by PCR NEGATIVE NEGATIVE Final    Comment: (NOTE) The Xpert Xpress SARS-CoV-2/FLU/RSV plus assay is intended as an aid in the diagnosis of influenza from Nasopharyngeal swab  specimens and should not be used as a sole basis for treatment. Nasal washings and aspirates are unacceptable for Xpert Xpress SARS-CoV-2/FLU/RSV testing.  Fact Sheet for Patients: BloggerCourse.com  Fact Sheet for Healthcare Providers: SeriousBroker.it  This test is not yet approved or cleared by the [place] FDA and has been authorized for detection and/or diagnosis of SARS-CoV-2 by FDA under an Emergency Use Authorization (EUA). This EUA will remain in effect (meaning this test can be used) for the duration of the COVID-19 declaration under Section 564(b)(1) of the Act, 21 U.S.C. section 360bbb-3(b)(1), unless the authorization is terminated or revoked.     Resp Syncytial Virus by PCR NEGATIVE NEGATIVE Final    Comment: (NOTE) Fact Sheet for Patients: BloggerCourse.com  Fact Sheet for Healthcare Providers: SeriousBroker.it  This test is not

## 2023-08-26 NOTE — Progress Notes (Signed)
I went to check on the pt during my round at 0158 pt was not breathing, no femoral pulse. I listened to her heart sound with stethoscope, no sounds of heartbeat. Charge nurse notified. Pt pronounced death by 2 RNs, Thedore Mins and Economist at [phone]. Hospitalist notified. Family member, [name] (son), notified about pt's decease. [employer] notified.

## 2023-08-26 DEATH — deceased
